# Patient Record
Sex: Male | Born: 1950
Health system: Southern US, Community
[De-identification: ages and names within clinical notes are randomized; demographics above are authoritative.]

## PROBLEM LIST (undated history)

## (undated) DIAGNOSIS — I10 Essential (primary) hypertension: Secondary | ICD-10-CM

## (undated) DIAGNOSIS — K219 Gastro-esophageal reflux disease without esophagitis: Secondary | ICD-10-CM

## (undated) DIAGNOSIS — N189 Chronic kidney disease, unspecified: Secondary | ICD-10-CM

## (undated) HISTORY — DX: Essential (primary) hypertension: I10

## (undated) HISTORY — DX: Gastro-esophageal reflux disease without esophagitis: K21.9

## (undated) HISTORY — DX: Chronic kidney disease, unspecified: N18.9

---

## 1995-07-13 DIAGNOSIS — F17200 Nicotine dependence, unspecified, uncomplicated: Secondary | ICD-10-CM | POA: Insufficient documentation

## 1995-08-28 DIAGNOSIS — K219 Gastro-esophageal reflux disease without esophagitis: Secondary | ICD-10-CM | POA: Insufficient documentation

## 1995-11-27 DIAGNOSIS — Z8709 Personal history of other diseases of the respiratory system: Secondary | ICD-10-CM | POA: Insufficient documentation

## 1998-11-02 DIAGNOSIS — M542 Cervicalgia: Secondary | ICD-10-CM | POA: Insufficient documentation

## 2005-10-27 DIAGNOSIS — L409 Psoriasis, unspecified: Secondary | ICD-10-CM | POA: Insufficient documentation

## 2011-08-02 DIAGNOSIS — Z7289 Other problems related to lifestyle: Secondary | ICD-10-CM | POA: Insufficient documentation

## 2012-04-29 DIAGNOSIS — E1142 Type 2 diabetes mellitus with diabetic polyneuropathy: Secondary | ICD-10-CM | POA: Insufficient documentation

## 2012-04-29 DIAGNOSIS — R209 Unspecified disturbances of skin sensation: Secondary | ICD-10-CM | POA: Insufficient documentation

## 2014-07-28 DIAGNOSIS — R809 Proteinuria, unspecified: Secondary | ICD-10-CM | POA: Insufficient documentation

## 2014-07-28 DIAGNOSIS — N1831 Chronic kidney disease, stage 3a: Secondary | ICD-10-CM | POA: Insufficient documentation

## 2014-10-23 DIAGNOSIS — Z87442 Personal history of urinary calculi: Secondary | ICD-10-CM | POA: Insufficient documentation

## 2014-10-23 DIAGNOSIS — K4020 Bilateral inguinal hernia, without obstruction or gangrene, not specified as recurrent: Secondary | ICD-10-CM | POA: Insufficient documentation

## 2015-06-07 DIAGNOSIS — D509 Iron deficiency anemia, unspecified: Secondary | ICD-10-CM | POA: Insufficient documentation

## 2016-10-08 DIAGNOSIS — I700 Atherosclerosis of aorta: Secondary | ICD-10-CM | POA: Insufficient documentation

## 2019-06-05 DIAGNOSIS — I639 Cerebral infarction, unspecified: Secondary | ICD-10-CM | POA: Insufficient documentation

## 2019-07-05 DIAGNOSIS — Z7901 Long term (current) use of anticoagulants: Secondary | ICD-10-CM | POA: Insufficient documentation

## 2020-06-28 ENCOUNTER — Encounter (HOSPITAL_BASED_OUTPATIENT_CLINIC_OR_DEPARTMENT_OTHER): Payer: Self-pay | Admitting: Family Medicine

## 2020-06-28 ENCOUNTER — Ambulatory Visit (INDEPENDENT_AMBULATORY_CARE_PROVIDER_SITE_OTHER): Payer: Medicare Other | Admitting: Family Medicine

## 2020-06-28 ENCOUNTER — Other Ambulatory Visit: Payer: Self-pay

## 2020-06-28 ENCOUNTER — Other Ambulatory Visit (HOSPITAL_BASED_OUTPATIENT_CLINIC_OR_DEPARTMENT_OTHER)
Admission: RE | Admit: 2020-06-28 | Discharge: 2020-06-28 | Disposition: A | Discharge status: Home / Self Care | Payer: Medicare Other | Source: Ambulatory Visit | Attending: Family Medicine | Admitting: Family Medicine

## 2020-06-28 VITALS — BP 144/70 | HR 67 | Ht 69.5 in | Wt 199.4 lb

## 2020-06-28 DIAGNOSIS — I10 Essential (primary) hypertension: Secondary | ICD-10-CM

## 2020-06-28 DIAGNOSIS — M545 Low back pain, unspecified: Secondary | ICD-10-CM | POA: Diagnosis not present

## 2020-06-28 DIAGNOSIS — E1122 Type 2 diabetes mellitus with diabetic chronic kidney disease: Secondary | ICD-10-CM

## 2020-06-28 DIAGNOSIS — N189 Chronic kidney disease, unspecified: Secondary | ICD-10-CM | POA: Insufficient documentation

## 2020-06-28 DIAGNOSIS — G8929 Other chronic pain: Secondary | ICD-10-CM

## 2020-06-28 DIAGNOSIS — Z7689 Persons encountering health services in other specified circumstances: Secondary | ICD-10-CM

## 2020-06-28 LAB — LIPID PANEL
Cholesterol: 105 mg/dL (ref 0–200)
HDL: 44 mg/dL (ref >40)
LDL Cholesterol: 14 mg/dL (ref 0–99)
Total CHOL/HDL Ratio: 2.4 RATIO
Triglycerides: 237 mg/dL — ABNORMAL HIGH (ref <150)
VLDL: 47 mg/dL — ABNORMAL HIGH (ref 0–40)

## 2020-06-28 LAB — CBC WITH DIFFERENTIAL/PLATELET
Abs Immature Granulocytes: 0.01 10*3/uL (ref 0.00–0.07)
Basophils Absolute: 0.1 10*3/uL (ref 0.0–0.1)
Basophils Relative: 1 %
Eosinophils Absolute: 1 10*3/uL — ABNORMAL HIGH (ref 0.0–0.5)
Eosinophils Relative: 12 %
HCT: 44.2 % (ref 39.0–52.0)
Hemoglobin: 14.1 g/dL (ref 13.0–17.0)
Immature Granulocytes: 0 %
Lymphocytes Relative: 25 %
Lymphs Abs: 2.1 10*3/uL (ref 0.7–4.0)
MCH: 28.3 pg (ref 26.0–34.0)
MCHC: 31.9 g/dL (ref 30.0–36.0)
MCV: 88.6 fL (ref 80.0–100.0)
Monocytes Absolute: 1.1 10*3/uL — ABNORMAL HIGH (ref 0.1–1.0)
Monocytes Relative: 13 %
Neutro Abs: 4.2 10*3/uL (ref 1.7–7.7)
Neutrophils Relative %: 49 %
Platelets: 166 10*3/uL (ref 150–400)
RBC: 4.99 MIL/uL (ref 4.22–5.81)
RDW: 13.8 % (ref 11.5–15.5)
WBC: 8.4 10*3/uL (ref 4.0–10.5)
nRBC: 0 % (ref 0.0–0.2)

## 2020-06-28 LAB — COMPREHENSIVE METABOLIC PANEL
ALT: 18 U/L (ref 0–44)
AST: 15 U/L (ref 15–41)
Albumin: 4.6 g/dL (ref 3.5–5.0)
Alkaline Phosphatase: 45 U/L (ref 38–126)
Anion gap: 9 (ref 5–15)
BUN: 24 mg/dL — ABNORMAL HIGH (ref 8–23)
CO2: 25 mmol/L (ref 22–32)
Calcium: 9.8 mg/dL (ref 8.9–10.3)
Chloride: 103 mmol/L (ref 98–111)
Creatinine, Ser: 1.48 mg/dL — ABNORMAL HIGH (ref 0.61–1.24)
GFR, Estimated: 51 mL/min — ABNORMAL LOW (ref >60)
Glucose, Bld: 101 mg/dL — ABNORMAL HIGH (ref 70–99)
Potassium: 4.4 mmol/L (ref 3.5–5.1)
Sodium: 137 mmol/L (ref 135–145)
Total Bilirubin: 0.5 mg/dL (ref 0.3–1.2)
Total Protein: 8 g/dL (ref 6.5–8.1)

## 2020-06-28 LAB — HEMOGLOBIN A1C
Hgb A1c MFr Bld: 7.2 % — ABNORMAL HIGH (ref 4.8–5.6)
Mean Plasma Glucose: 159.94 mg/dL

## 2020-06-28 NOTE — Assessment & Plan Note (Signed)
Check labs today, request records from prior PCP and nephrologist Continue with current medications including Metformin Current medication list indicates dose adjustment for moderate kidney disease

## 2020-06-28 NOTE — Progress Notes (Signed)
New Patient Office Visit  Subjective:  Patient ID: Frederick Tyler, male    DOB: 04-17-1950  Age: 69 y.o. MRN: [id number]  CC:  Chief Complaint  Patient presents with  . Establish Care  . Diabetes  . Back Pain    Lower back pain presented 2 week ago. Pt states pain is aggravated by prolonged standing or bending.    HPI Frederick Tyler is a 70 year old male presenting to establish in clinic.  He reports past medical history significant for hypertension, diabetes, GERD, chronic kidney disease, history of 2 prior strokes.  Does have a concern today regarding bilateral low back pain.  Patient has recently moved to the area from [place].  Low back pain: Has been present for a couple months.  Started in relation to recent move.  Pain is worsened with bending over, increased activity.  Indicates pain is in bilateral lumbar spine, no pain centrally.  Some radiation of pain into left buttock.  Has not tried much in the way of conservative treatment.  Hypertension: Reportedly taking losartan, isosorbide, nifedipine currently.  Denies any recent issues with chest pain, shortness of breath.  Diabetes: Reports A1c recently have been good.  Only medication for this is Metformin 500 mg twice daily.  Also engages in lifestyle modifications.  Chronic kidney disease: Was following with nephrology before moving to the area.  Does have to have dosage of medications adjusted due to current status of chronic kidney disease.  Specific staging of chronic kidney disease not known by patient.  Past Medical History:  Diagnosis Date  . Chronic kidney disease   . GERD (gastroesophageal reflux disease)   . Hypertension     History reviewed. No pertinent surgical history.  Family History  Family history unknown: Yes    Social History   Socioeconomic History  . Marital status: Married    Spouse name: Not on file  . Number of children: Not on file  . Years of education: Not on file  . Highest education level:  Not on file  Occupational History  . Not on file  Tobacco Use  . Smoking status: Never Smoker  . Smokeless tobacco: Never Used  Vaping Use  . Vaping Use: Never used  Substance and Sexual Activity  . Alcohol use: Never  . Drug use: Never  . Sexual activity: Yes    Birth control/protection: None  Other Topics Concern  . Not on file  Social History Narrative  . Not on file   Social Determinants of Health   Financial Resource Strain: Not on file  Food Insecurity: Not on file  Transportation Needs: Not on file  Physical Activity: Not on file  Stress: Not on file  Social Connections: Not on file  Intimate Partner Violence: Not on file    Objective:   Today's Vitals: BP (!) 144/70   Pulse 67   Ht 5' 9.5" (1.765 m)   Wt 199 lb 6.4 oz (90.4 kg)   SpO2 97%   BMI 29.02 kg/m   Physical Exam  Pleasant 70 year old male in no acute distress Cardiovascular him with regular rate and rhythm, no murmurs appreciated Lungs clear to auscultation bilaterally Lumbar spine: No tenderness to palpation over spinous processes, no tenderness to palpation through paraspinal muscles bilaterally.  Negative straight raise test.  Distal neurovascular exam intact.  Assessment & Plan:   Problem List Items Addressed This Visit      Cardiovascular and Mediastinum   Hypertension - Primary    Blood pressure very  slightly above goal with systolic at 144.  Goal blood pressure less than 140/90 given presence of diabetes and CKD. At this time, continue with current pharmacotherapy Will request records to verify medication dosages as well as prior blood pressure readings on current regimen Encourage lifestyle modifications, adherence to DASH diet, regular aerobic activity during the week.      Relevant Medications   losartan (COZAAR) 50 MG tablet   atenolol (TENORMIN) 100 MG tablet   aspirin 81 MG chewable tablet   isosorbide mononitrate (IMDUR) 60 MG 24 hr tablet   rosuvastatin (CRESTOR) 20 MG  tablet   Rivaroxaban (XARELTO) 15 MG TABS tablet   Other Relevant Orders   Ambulatory referral to Nephrology   CBC with Differential/Platelet   Comprehensive metabolic panel   Hemoglobin A1c   Lipid panel   POCT UA - Microalbumin     Endocrine   Diabetes mellitus with chronic kidney disease (HCC)    Check labs today, request records from prior PCP and nephrologist Continue with current medications including Metformin Current medication list indicates dose adjustment for moderate kidney disease      Relevant Medications   metFORMIN (GLUCOPHAGE) 500 MG tablet   losartan (COZAAR) 50 MG tablet   aspirin 81 MG chewable tablet   rosuvastatin (CRESTOR) 20 MG tablet   Other Relevant Orders   Ambulatory referral to Nephrology   Ambulatory referral to Ophthalmology   CBC with Differential/Platelet   Comprehensive metabolic panel   Hemoglobin A1c   Lipid panel     Other   Chronic low back pain    New problem, active Suspect musculoskeletal etiology for current symptoms Can use conservative measures including topical treatments with ice or heat as preferred Will refer to physical therapy for further evaluation and management and instruction on home exercise program      Relevant Medications   aspirin 81 MG chewable tablet   Other Relevant Orders   Ambulatory referral to Physical Therapy    Other Visit Diagnoses    Encounter to establish care with new doctor       Relevant Orders   Ambulatory referral to Nephrology   CBC with Differential/Platelet   Comprehensive metabolic panel   Hemoglobin A1c   Lipid panel   POCT UA - Microalbumin      Outpatient Encounter Medications as of 06/28/2020  Medication Sig  . aspirin 81 MG chewable tablet Chew 81 mg by mouth daily.  Marland Kitchen atenolol (TENORMIN) 100 MG tablet Take 100 mg by mouth 2 (two) times daily.  . famotidine (PEPCID) 20 MG tablet Take 40 mg by mouth at bedtime.  . isosorbide mononitrate (IMDUR) 60 MG 24 hr tablet Take 60 mg by  mouth daily.  Marland Kitchen loratadine (CLARITIN) 10 MG tablet Take 10 mg by mouth daily.  Marland Kitchen losartan (COZAAR) 50 MG tablet Take 50 mg by mouth daily.  . metFORMIN (GLUCOPHAGE) 500 MG tablet Take 500 mg by mouth 2 (two) times daily with a meal.  . Probiotic Product (PROBIOTIC BLEND PO) Take by mouth. NuZymes  . Rivaroxaban (XARELTO) 15 MG TABS tablet Take 15 mg by mouth 2 (two) times daily with a meal.  . rosuvastatin (CRESTOR) 20 MG tablet Take 20 mg by mouth daily.   No facility-administered encounter medications on file as of 06/28/2020.   Spent 60 minutes on this patient encounter, including preparation, chart review, face-to-face counseling with patient and coordination of care, and documentation of encounter  Follow-up: Return in about 2 months (around 08/28/2020).  Elijiah Mickley J De Peru, MD

## 2020-06-28 NOTE — Assessment & Plan Note (Signed)
Blood pressure very slightly above goal with systolic at 144.  Goal blood pressure less than 140/90 given presence of diabetes and CKD. At this time, continue with current pharmacotherapy Will request records to verify medication dosages as well as prior blood pressure readings on current regimen Encourage lifestyle modifications, adherence to DASH diet, regular aerobic activity during the week.

## 2020-06-28 NOTE — Patient Instructions (Addendum)
  Medication Instructions:  Your physician recommends that you continue on your current medications as directed. Please refer to the Current Medication list given to you today. --If you need a refill on any your medications before your next appointment, please call your pharmacy first. If no refills are authorized on file call the office.--  Lab Work: Your physician has recommended that you have lab work today: CBC, CMP, A1C, Lipid Profile,  And Urine Microalbumin If you have labs (blood work) drawn today and your tests are completely normal, you will receive your results only by: Marland Kitchen MyChart Message (if you have MyChart) OR . A phone call from our staff. Please ensure you check your voicemail in the event that you authorized detailed messages to be left on a delegated number. If you have any lab test that is abnormal or we need to change your treatment, we will call you to review the results.  Referrals/Procedures/Imaging: A referral has been placed for you to Ophthalmology for your annual Diabetic Retinal Exam. Someone from the scheduling department will be in contact with you in regards to coordinating your consultation. If you do not hear from any of the schedulers within 7-10 business days please give our office a call.  A referral has been placed for you to Nephrology for Chronic Kidney Disease. Someone from the scheduling department will be in contact with you in regards to coordinating your consultation. If you do not hear from any of the schedulers within 7-10 business days please give our office a call.  A referral has been placed for you to Physical Therapy at [facility]. Someone from the scheduling department will be in contact with you in regards to coordinating your consultation. If you do not hear from any of the schedulers within 7-10 business days please give our office a call.  Follow-Up: Your next appointment:   Your physician recommends that you  schedule a follow-up appointment in: 2 MONTHS witth Dr. De Peru  Thanks for letting us be apart of your health journey!!  Primary Care and Sports Medicine   Dr. de Peru and Shawna Clamp, DNP, AGNP  We recommend signing up for the patient portal called "MyChart".  Sign up information is provided on this After Visit Summary.  MyChart is used to connect with patients for Virtual Visits (Telemedicine).  Patients are able to view lab/test results, encounter notes, upcoming appointments, etc.  Non-urgent messages can be sent to your provider as well.   To learn more about what you can do with MyChart, go to [email or web address].

## 2020-06-28 NOTE — Assessment & Plan Note (Signed)
New problem, active Suspect musculoskeletal etiology for current symptoms Can use conservative measures including topical treatments with ice or heat as preferred Will refer to physical therapy for further evaluation and management and instruction on home exercise program

## 2020-06-29 ENCOUNTER — Telehealth (HOSPITAL_BASED_OUTPATIENT_CLINIC_OR_DEPARTMENT_OTHER): Payer: Self-pay

## 2020-06-29 DIAGNOSIS — E1122 Type 2 diabetes mellitus with diabetic chronic kidney disease: Secondary | ICD-10-CM

## 2020-06-29 LAB — POCT UA - MICROALBUMIN
Creatinine, POC: 300 mg/dL
Microalbumin Ur, POC: 150 mg/L

## 2020-06-29 NOTE — Telephone Encounter (Signed)
Upon verifying patients referrals were completed saw that patients referral to opthalmology for Diabetic retinal exam was denied Will put in a new order for scheduling.

## 2020-06-30 ENCOUNTER — Telehealth (HOSPITAL_BASED_OUTPATIENT_CLINIC_OR_DEPARTMENT_OTHER): Payer: Self-pay

## 2020-06-30 NOTE — Telephone Encounter (Signed)
-----   Message from Hosie Poisson Peru, MD sent at 06/30/2020 10:14 AM EDT ----- Urine testing for protein indicates moderately increased albuminuria (increased protein in the urine above normal due to diabetes). As discussed at office visit, referral has been placed to establish with Nephrology in regards to managing chronic kidney disease.

## 2020-06-30 NOTE — Telephone Encounter (Signed)
Results released by Dr. De [place] and reviewed by patient via MyChart Instructed patient to contact the office with any questions or concerns. 

## 2020-06-30 NOTE — Telephone Encounter (Signed)
-----   Message from Hosie Poisson Peru, MD sent at 06/29/2020  3:13 PM EDT ----- Hemoglobin A1c at 7.2% -feel that this is adequate control given age and comorbidities, likely need to strive for A1c to be less than 7.5%.  Lipid panel reveals normal total cholesterol, normal "good" cholesterol, normal "bad" cholesterol, slightly elevated triglycerides -recommend continuing with statin medication and lifestyle modifications.  Normal electrolytes, normal liver function, increased creatinine and decreased GFR, patient with known chronic kidney disease, will need to await outside records in order to compare current lab results with priors and ensure that it is stable.There is a mild increase in a specific type of white blood cell (eosinophil) - would recommend rechecking CBC in about 6 months to monitor this.

## 2020-07-05 ENCOUNTER — Telehealth (HOSPITAL_BASED_OUTPATIENT_CLINIC_OR_DEPARTMENT_OTHER): Payer: Self-pay | Admitting: Family Medicine

## 2020-07-05 NOTE — Telephone Encounter (Signed)
Tried obtaining records from patients  previous provider. The providers office has no record of pt for the past 5 years. Will re-ask pt when he comes in for f/u appt. Please advise.

## 2020-07-13 ENCOUNTER — Encounter (HOSPITAL_BASED_OUTPATIENT_CLINIC_OR_DEPARTMENT_OTHER): Payer: Self-pay | Admitting: Physical Therapy

## 2020-07-13 ENCOUNTER — Other Ambulatory Visit: Payer: Self-pay

## 2020-07-13 ENCOUNTER — Ambulatory Visit (HOSPITAL_BASED_OUTPATIENT_CLINIC_OR_DEPARTMENT_OTHER): Payer: Medicare Other | Attending: Family Medicine | Admitting: Physical Therapy

## 2020-07-13 DIAGNOSIS — M6281 Muscle weakness (generalized): Secondary | ICD-10-CM | POA: Insufficient documentation

## 2020-07-13 DIAGNOSIS — M545 Low back pain, unspecified: Secondary | ICD-10-CM | POA: Diagnosis not present

## 2020-07-13 DIAGNOSIS — R262 Difficulty in walking, not elsewhere classified: Secondary | ICD-10-CM | POA: Insufficient documentation

## 2020-07-13 DIAGNOSIS — G8929 Other chronic pain: Secondary | ICD-10-CM | POA: Insufficient documentation

## 2020-07-13 NOTE — Patient Instructions (Signed)
Access Code: [email or web address] URL: [email or web address] Date: 07/13/2020 Prepared by: Vernon Prey April Kirstie Peri  Exercises Supine Bridge - 1 x daily - 7 x weekly - 2 sets - 10 reps Clamshell with Resistance - 1 x daily - 7 x weekly - 2 sets - 10 reps Supine Lower Trunk Rotation - 1 x daily - 7 x weekly - 3 sets - 20-30 sec hold Cat-Camel to Child's Pose - 1 x daily - 7 x weekly - 3 sets - 20 sec hold Child's Pose with Sidebending - 1 x daily - 7 x weekly - 3 sets - 20-30 sec hold

## 2020-07-13 NOTE — Addendum Note (Signed)
Addended by: Jules Husbands MARIE L on: 07/13/2020 02:42 PM   Modules accepted: Orders

## 2020-07-13 NOTE — Therapy (Signed)
[facility] [address] [place], [place], [address] Phone: [phone]   Fax:  [phone]  Physical Therapy Evaluation  Patient Details  Name: Frederick Tyler MRN: [id number] Date of Birth: January 10, 1951 Referring Provider (PT): de Peru, Buren Kos, MD   Encounter Date: 07/13/2020   PT End of Session - 07/13/20 1331    Visit Number 1    Number of Visits 7    Date for PT Re-Evaluation 08/24/20    Authorization Type BC[employer]    PT Start Time 1340    PT Stop Time 1425    PT Time Calculation (min) 45 min    Activity Tolerance Patient tolerated treatment well    Behavior During Therapy [facility] for tasks assessed/performed           Past Medical History:  Diagnosis Date  . Chronic kidney disease   . GERD (gastroesophageal reflux disease)   . Hypertension     History reviewed. No pertinent surgical history.  There were no vitals filed for this visit.    Subjective Assessment - 07/13/20 1333    Subjective Pt has a back issue for ~20 years. Pt feels it most when he lifts something wrong (it takes about 2-3 weeks to mellow out). Pt states as he is getting older and he can feel it more. Pt states that it can go down either leg. Pt can also feel it when walking up inclines including stair cases. Pt has been walking a mile every day. Pt reports he slipped his disc in his 41s.    Patient is accompained by: Family member    Pertinent History CKD, GERD, HTN    Limitations Lifting;Standing;Walking    How long can you sit comfortably? no issues    How long can you stand comfortably? If standing straight it is okay, but leaning forward for ~10-15 minutes he can feel it.    How long can you walk comfortably? Level walking he can feel it in his calves (reports history of blockage in both legs).    Patient Stated Goals Decrease pain with walking, exercises to help with strength    Currently in Pain? No/denies   Sitting   Pain Score --   5/10 at worst  when going up incline   Pain Location Back    Pain Orientation Left;Right    Pain Descriptors / Indicators Tightness;Sharp    Pain Type Chronic pain    Pain Radiating Towards Either leg    Pain Onset More than a month ago    Pain Frequency Intermittent    Aggravating Factors  Lifting, walking inclines,    Pain Relieving Factors Topical cream, heat or ice              [facility] PT Assessment - 07/13/20 0001      Assessment   Medical Diagnosis M54.50,G89.29 (ICD-10-CM) - Chronic bilateral low back pain without sciatica    Referring Provider (PT) de Peru, Buren Kos, MD    Prior Therapy None; has used chiropractor      Precautions   Precautions None      Restrictions   Weight Bearing Restrictions No      Balance Screen   Has the patient fallen in the past 6 months No      Home Environment   Living Environment Private residence    Living Arrangements Spouse/significant other    Available Help at Discharge Family    Type of Home House    Home Access  Stairs to enter    [employer] of Steps 2-3    Home Layout One level    Home Equipment None      Prior Function   Level of Independence Independent    Vocation Retired    Leisure Walking      Observation/Other Assessments   Focus on Therapeutic Outcomes (FOTO)  n/a      Functional Tests   Functional tests Sit to Stand;Squat;Single leg stance      Squat   Comments Increased knee flexion, weight forward on toes, decreased BOS      Single Leg Stance   Comments L LE (compensated trendelenburg): 37 sec; R LE (compensated trendelenburg): 31 sec      Sit to Stand   Comments 5x STS: 12 sec      ROM / Strength   AROM / PROM / Strength AROM;Strength      AROM   AROM Assessment Site Lumbar    Lumbar Flexion 100%    Lumbar Extension 100%    Lumbar - Right Side Bend 75%   Feels on right   Lumbar - Left Side Bend 90%   Can feel on right side of back   Lumbar - Right Rotation 80%    Lumbar - Left Rotation 80%       Strength   Strength Assessment Site Hip;Knee;Ankle    Right/Left Hip Right;Left    Right Hip Flexion 5/5    Right Hip Extension 3+/5    Right Hip External Rotation  4-/5    Right Hip Internal Rotation 4+/5    Right Hip ABduction 4-/5    Left Hip Flexion 5/5    Left Hip Extension 4-/5    Left Hip External Rotation 4-/5    Left Hip Internal Rotation 4+/5    Left Hip ABduction 4-/5    Right/Left Knee --   Grossly 5/5     Flexibility   Soft Tissue Assessment /Muscle Length yes    Hamstrings >100 deg L SLR, ~80 deg R SLR      Palpation   Palpation comment Tight and short QL and glute      Special Tests    Special Tests Lumbar    Lumbar Tests FABER test;Slump Test;Prone Knee Bend Test;Straight Leg Raise      FABER test   findings Negative    Comment feels in R & L hip (R tighter than L)      Slump test   Findings Negative      Prone Knee Bend Test   Findings Negative      Straight Leg Raise   Findings Negative    Comment R tighter hamstring than L      Ambulation/Gait   Ambulation Distance (Feet) 150 Feet    Assistive device None    Gait Pattern Step-through pattern    Ambulation Surface Level;Indoor                      Objective measurements completed on examination: See above findings.               PT Education - 07/13/20 1440    Education Details Discussed exam findings, POC, and HEP    Person(s) Educated Patient    Methods Explanation;Demonstration;Verbal cues;Handout;Tactile cues    Comprehension Verbalized understanding;Tactile cues required;Need further instruction;Verbal cues required;Returned demonstration               PT Long Term Goals - 07/13/20 1432  PT LONG TERM GOAL #1   Title Pt will be independent with HEP    Time 6    Period Weeks    Status New    Target Date 08/24/20      PT LONG TERM GOAL #2   Title Pt will be able to report >50% decrease in pain when walking inclines and stairs    Baseline 5/10  at worst    Time 6    Period Weeks    Status New    Target Date 08/24/20      PT LONG TERM GOAL #3   Title Pt will be able to demo improved functional hip strength by performing at least 10 squats with proper form    Baseline Can feel quad fatigue after 3 squats    Time 6    Period Weeks    Status New    Target Date 08/24/20                  Plan - 07/13/20 1424    Clinical Impression Statement Mr. Iden is a 70 y/o M presenting to OPPT due to chronic bilat low back pain. PMH significant for CKD, GERD, HTN, and reports CVA 6 months ago (reports most of issue was his peripheral vision and balance). On assessment, pt demos R>L hip tightness, hyperactive and shortened bilat QLs, and weak hip extensors & abductors affecting pt's ability to perform prolonged lean forwards and walking up inclines and stairs. Pt would benefit from therapy to address his mobility issues to optimize his level of function for his leisure activities.    Personal Factors and Comorbidities Age;Fitness;Past/Current Experience;Comorbidity 1;Time since onset of injury/illness/exacerbation    Comorbidities CKD, GERD, HTN, stroke 6 months ago (peripheral vision and balance affected)    Examination-Activity Limitations Locomotion Level;Stairs;Stand    Examination-Participation Restrictions Community Activity;Yard Work    Conservation officer, historic buildings Evolving/Moderate complexity    Clinical Decision Making Moderate    Rehab Potential Good    PT Frequency 1x / week    PT Duration 6 weeks    PT Treatment/Interventions ADLs/Self Care Home Management;Iontophoresis 4mg /ml Dexamethasone;Moist Heat;Traction;Cryotherapy;Gait training;Stair training;Functional mobility training;Therapeutic activities;Therapeutic exercise;Balance training;Neuromuscular re-education;Manual techniques;Patient/family education;Passive range of motion;Dry needling;Taping;Spinal Manipulations    PT Next Visit Plan Assess response to HEP.  Include hamstring and more hip stretching. Continue hip (especially glute) strengthening -- consider exercises in standing. Work and .    PT Home Exercise Plan Access Code: [email or web address]    Consulted and Agree with Plan of Care Patient           Patient will benefit from skilled therapeutic intervention in order to improve the following deficits and impairments:  Difficulty walking,Increased fascial restricitons,Decreased activity tolerance,Pain,Hypomobility,Impaired flexibility,Improper body mechanics,Decreased mobility,Decreased strength  Visit Diagnosis: Chronic bilateral low back pain, unspecified whether sciatica present  Muscle weakness (generalized)  Difficulty in walking, not elsewhere classified     Problem List Patient Active Problem List   Diagnosis Date Noted  . Chronic kidney disease 06/28/2020  . Diabetes mellitus with chronic kidney disease (HCC) 06/28/2020  . Hypertension 06/28/2020  . Chronic low back pain 06/28/2020    [facility] [address] [place] PT, DPT 07/13/2020, 2:41 PM  [facility] [address] [place], [place], [place] Phone: [phone]   Fax:  [phone]  Name: Frederick Tyler MRN: [name] Date of Birth: 09/06/1950

## 2020-07-20 ENCOUNTER — Other Ambulatory Visit: Payer: Self-pay

## 2020-07-20 ENCOUNTER — Encounter (HOSPITAL_BASED_OUTPATIENT_CLINIC_OR_DEPARTMENT_OTHER): Payer: Self-pay | Admitting: Physical Therapy

## 2020-07-20 ENCOUNTER — Ambulatory Visit (HOSPITAL_BASED_OUTPATIENT_CLINIC_OR_DEPARTMENT_OTHER): Payer: Medicare Other | Attending: Family Medicine | Admitting: Physical Therapy

## 2020-07-20 DIAGNOSIS — G8929 Other chronic pain: Secondary | ICD-10-CM | POA: Insufficient documentation

## 2020-07-20 DIAGNOSIS — M545 Low back pain, unspecified: Secondary | ICD-10-CM | POA: Diagnosis not present

## 2020-07-20 DIAGNOSIS — R262 Difficulty in walking, not elsewhere classified: Secondary | ICD-10-CM | POA: Diagnosis not present

## 2020-07-20 DIAGNOSIS — M6281 Muscle weakness (generalized): Secondary | ICD-10-CM | POA: Diagnosis not present

## 2020-07-20 NOTE — Therapy (Signed)
[facility] [address] [place], [place], [address] Phone: [phone]   Fax:  [phone]  Physical Therapy Treatment  Patient Details  Name: Frederick Tyler MRN: [id number] Date of Birth: 1950/05/08 Referring Provider (PT): de [place], Buren Kos, MD   Encounter Date: 07/20/2020   PT End of Session - 07/20/20 1639    Visit Number 2    Number of Visits 7    Date for PT Re-Evaluation 08/24/20    Authorization Type BCBS    PT Start Time 1300    PT Stop Time 1340    PT Time Calculation (min) 40 min    Activity Tolerance Patient tolerated treatment well    Behavior During Therapy [facility] for tasks assessed/performed           Past Medical History:  Diagnosis Date  . Chronic kidney disease   . GERD (gastroesophageal reflux disease)   . Hypertension     History reviewed. No pertinent surgical history.  There were no vitals filed for this visit.   Subjective Assessment - 07/20/20 1307    Subjective Patient had some pain in his hips yesterday but he was able to use his stretches to reduce his pain. He has been working on his stretches and exercises at home.    Pertinent History CKD, GERD, HTN    Limitations Lifting;Standing;Walking    How long can you sit comfortably? no issues    How long can you stand comfortably? If standing straight it is okay, but leaning forward for ~10-15 minutes he can feel it.    How long can you walk comfortably? Level walking he can feel it in his calves (reports history of blockage in both legs).    Patient Stated Goals Decrease pain with walking, exercises to help with strength    Currently in Pain? No/denies   mild pain yesterday but gone today.                            [facility] Adult PT Treatment/Exercise - 07/20/20 0001      Lumbar Exercises: Stretches   Active Hamstring Stretch Limitations 3x20 sec hold seated    Lower Trunk Rotation Limitations x10 20 sec hold bilateral  reports this is working well for hom at home    Piriformis Stretch Limitations 2x30 sec hold bilateral    Other Lumbar Stretch Exercise reviewed plateral paryer with hand held traction improved stretch 2x20 sec each side;      Lumbar Exercises: Aerobic   Nustep 5 min L3 UE /LE      Lumbar Exercises: Seated   Other Seated Lumbar Exercises bilateral er green with abdominal breathing and cuing for posture 2x15      Lumbar Exercises: Supine   AB Set Limitations reviewed proper breathing technique    Bridge Limitations 2x10    Other Supine Lumbar Exercises supine march 3x10 reviewed how to progress;      Manual Therapy   Manual Therapy Manual Traction    Manual therapy comments assessed trigger points    Manual Traction LAD to right LE grade III and IV                  PT Education - 07/20/20 1639    Education Details updated HEP, reviewed how to use it    Person(s) Educated Patient    Methods Explanation;Tactile cues;Demonstration;Verbal cues;Handout    Comprehension Verbalized understanding;Returned demonstration;Verbal cues required;Tactile cues required;Need  further instruction               PT Long Term Goals - 07/13/20 1432      PT LONG TERM GOAL #1   Title Pt will be independent with HEP    Time 6    Period Weeks    Status New    Target Date 08/24/20      PT LONG TERM GOAL #2   Title Pt will be able to report >50% decrease in pain when walking inclines and stairs    Baseline 5/10 at worst    Time 6    Period Weeks    Status New    Target Date 08/24/20      PT LONG TERM GOAL #3   Title Pt will be able to demo improved functional hip strength by performing at least 10 squats with proper form    Baseline Can feel quad fatigue after 3 squats    Time 6    Period Weeks    Status New    Target Date 08/24/20                 Plan - 07/20/20 1640    Clinical Impression Statement Patient is making progress. Patient assessed trigger points. He had a  mild spasm in his right gluteal. He otherwise had no trigger points. He was given a new UE exercise to work on. Therapy reviewed stretches. He had some difficulty at home with lateral prayer stretch. Therapy reviewed it with the patient and worked on technique. He reported improved ability to perfrom with cuing. He was also given exercices he can do in sitting.    Personal Factors and Comorbidities Age;Fitness;Past/Current Experience;Comorbidity 1;Time since onset of injury/illness/exacerbation    Comorbidities CKD, GERD, HTN, stroke 6 months ago (peripheral vision and balance affected)    Examination-Activity Limitations Locomotion Level;Stairs;Stand    Examination-Participation Restrictions Community Activity;Yard Work    Conservation officer, historic buildings Evolving/Moderate complexity    Clinical Decision Making Moderate    Rehab Potential Good    PT Frequency 1x / week    PT Duration 6 weeks    PT Treatment/Interventions ADLs/Self Care Home Management;Iontophoresis 4mg /ml Dexamethasone;Moist Heat;Traction;Cryotherapy;Gait training;Stair training;Functional mobility training;Therapeutic activities;Therapeutic exercise;Balance training;Neuromuscular re-education;Manual techniques;Patient/family education;Passive range of motion;Dry needling;Taping;Spinal Manipulations    PT Next Visit Plan Assess response to HEP. Include hamstring and more hip stretching. Continue hip (especially glute) strengthening -- consider exercises in standing. Work and .    PT Home Exercise Plan Access Code: [email or web address]    Consulted and Agree with Plan of Care Patient           Patient will benefit from skilled therapeutic intervention in order to improve the following deficits and impairments:  Difficulty walking,Increased fascial restricitons,Decreased activity tolerance,Pain,Hypomobility,Impaired flexibility,Improper body mechanics,Decreased mobility,Decreased strength  Visit Diagnosis: Chronic  bilateral low back pain, unspecified whether sciatica present  Muscle weakness (generalized)  Difficulty in walking, not elsewhere classified     Problem List Patient Active Problem List   Diagnosis Date Noted  . Chronic kidney disease 06/28/2020  . Diabetes mellitus with chronic kidney disease (HCC) 06/28/2020  . Hypertension 06/28/2020  . Chronic low back pain 06/28/2020    08/28/2020 PT DPT  07/20/2020, 4:48 PM  [facility] [address] [place], [place], [place] Phone: [phone]   Fax:  [phone]  Name: Frederick Tyler MRN: [name] Date of Birth: [birth year]

## 2020-07-26 DIAGNOSIS — I129 Hypertensive chronic kidney disease with stage 1 through stage 4 chronic kidney disease, or unspecified chronic kidney disease: Secondary | ICD-10-CM | POA: Diagnosis not present

## 2020-07-26 DIAGNOSIS — D631 Anemia in chronic kidney disease: Secondary | ICD-10-CM | POA: Diagnosis not present

## 2020-07-26 DIAGNOSIS — N1831 Chronic kidney disease, stage 3a: Secondary | ICD-10-CM | POA: Diagnosis not present

## 2020-07-26 DIAGNOSIS — N2581 Secondary hyperparathyroidism of renal origin: Secondary | ICD-10-CM | POA: Diagnosis not present

## 2020-07-27 ENCOUNTER — Ambulatory Visit (HOSPITAL_BASED_OUTPATIENT_CLINIC_OR_DEPARTMENT_OTHER): Payer: Medicare Other | Attending: Family Medicine | Admitting: Physical Therapy

## 2020-07-27 ENCOUNTER — Other Ambulatory Visit: Payer: Self-pay | Admitting: Nephrology

## 2020-07-27 ENCOUNTER — Other Ambulatory Visit: Payer: Self-pay

## 2020-07-27 ENCOUNTER — Encounter (HOSPITAL_BASED_OUTPATIENT_CLINIC_OR_DEPARTMENT_OTHER): Payer: Self-pay | Admitting: Physical Therapy

## 2020-07-27 DIAGNOSIS — G8929 Other chronic pain: Secondary | ICD-10-CM | POA: Insufficient documentation

## 2020-07-27 DIAGNOSIS — R262 Difficulty in walking, not elsewhere classified: Secondary | ICD-10-CM | POA: Diagnosis not present

## 2020-07-27 DIAGNOSIS — M6281 Muscle weakness (generalized): Secondary | ICD-10-CM | POA: Insufficient documentation

## 2020-07-27 DIAGNOSIS — M545 Low back pain, unspecified: Secondary | ICD-10-CM | POA: Insufficient documentation

## 2020-07-27 DIAGNOSIS — N1831 Chronic kidney disease, stage 3a: Secondary | ICD-10-CM

## 2020-07-28 NOTE — Therapy (Signed)
[facility] [address] [place], [place], [address] Phone: [phone]   Fax:  [phone]  Physical Therapy Treatment  Patient Details  Name: Nathaneal Sommers MRN: [id number] Date of Birth: [birth year] Referring Provider (PT): de [place], Buren Kos, MD   Encounter Date: 07/27/2020   PT End of Session - 07/28/20 1621    Visit Number 3    Number of Visits 7    Date for PT Re-Evaluation 08/24/20    Authorization Type BCBS    PT Start Time 1345    PT Stop Time 1424    PT Time Calculation (min) 39 min    Activity Tolerance Patient tolerated treatment well    Behavior During Therapy [facility] for tasks assessed/performed           Past Medical History:  Diagnosis Date  . Chronic kidney disease   . GERD (gastroesophageal reflux disease)   . Hypertension     History reviewed. No pertinent surgical history.  There were no vitals filed for this visit.   Subjective Assessment - 07/28/20 1253    Subjective Patient reports mid back tightness but no significant pain in his lower back and hips. He has been going to the gym and walking.    Pertinent History CKD, GERD, HTN    Limitations Lifting;Standing;Walking    Currently in Pain? No/denies                             [facility] Adult PT Treatment/Exercise - 07/28/20 0001      Lumbar Exercises: Stretches   Active Hamstring Stretch Limitations 3x20 sec hold seated    Lower Trunk Rotation Limitations x10 20 sec hold bilateral reports this is working well for hom at home    Piriformis Stretch Limitations 2x30 sec hold bilateral    Other Lumbar Stretch Exercise reviewed plateral paryer with hand held traction improved stretch 2x20 sec each side;      Lumbar Exercises: Standing   Other Standing Lumbar Exercises chops 10lbs 2x10 each way pallof press 2x10 each way triceps extension 2x10 each way 10lbs all with cuing for posture and breathing      Lumbar Exercises: Supine   AB  Set Limitations reviewed proper breathing technique    Bridge Limitations 2x10    Other Supine Lumbar Exercises supine march 3x10 reviewed how to progress;      Manual Therapy   Manual therapy comments PA to thoracic spine and soft tissue mobilization to the paraspainls                  PT Education - 07/28/20 1621    Education Details HEP and symptom mangement    Person(s) Educated Patient    Methods Explanation;Demonstration;Tactile cues;Verbal cues    Comprehension Verbalized understanding;Returned demonstration;Verbal cues required;Tactile cues required               PT Long Term Goals - 07/13/20 1432      PT LONG TERM GOAL #1   Title Pt will be independent with HEP    Time 6    Period Weeks    Status New    Target Date 08/24/20      PT LONG TERM GOAL #2   Title Pt will be able to report >50% decrease in pain when walking inclines and stairs    Baseline 5/10 at worst    Time 6    Period Weeks  Status New    Target Date 08/24/20      PT LONG TERM GOAL #3   Title Pt will be able to demo improved functional hip strength by performing at least 10 squats with proper form    Baseline Can feel quad fatigue after 3 squats    Time 6    Period Weeks    Status New    Target Date 08/24/20                 Plan - 07/27/20 1421    Clinical Impression Statement Patient is making great progress. He had some mid back oain this morning. therapy perfromed manual therapy to the spot. he had mild trigger points. He was given a tennis ball for self trigger point release. Therapy reviewed gym stablization exercises with him as well. he reported feeling good after treatemnt.    Personal Factors and Comorbidities Age;Fitness;Past/Current Experience;Comorbidity 1;Time since onset of injury/illness/exacerbation    Comorbidities CKD, GERD, HTN, stroke 6 months ago (peripheral vision and balance affected)    Examination-Activity Limitations Locomotion Level;Stairs;Stand     Examination-Participation Restrictions Community Activity;Yard Work    Conservation officer, historic buildings Evolving/Moderate complexity    Clinical Decision Making Moderate    Rehab Potential Good    PT Frequency 1x / week    PT Duration 6 weeks    PT Treatment/Interventions ADLs/Self Care Home Management;Iontophoresis 4mg /ml Dexamethasone;Moist Heat;Traction;Cryotherapy;Gait training;Stair training;Functional mobility training;Therapeutic activities;Therapeutic exercise;Balance training;Neuromuscular re-education;Manual techniques;Patient/family education;Passive range of motion;Dry needling;Taping;Spinal Manipulations    PT Next Visit Plan Assess response to HEP. Include hamstring and more hip stretching. Continue hip (especially glute) strengthening -- consider exercises in standing. Work and .    PT Home Exercise Plan Access Code: [email or web address]    Consulted and Agree with Plan of Care Patient           Patient will benefit from skilled therapeutic intervention in order to improve the following deficits and impairments:  Difficulty walking,Increased fascial restricitons,Decreased activity tolerance,Pain,Hypomobility,Impaired flexibility,Improper body mechanics,Decreased mobility,Decreased strength  Visit Diagnosis: Chronic bilateral low back pain, unspecified whether sciatica present  Muscle weakness (generalized)  Difficulty in walking, not elsewhere classified     Problem List Patient Active Problem List   Diagnosis Date Noted  . Chronic kidney disease 06/28/2020  . Diabetes mellitus with chronic kidney disease (HCC) 06/28/2020  . Hypertension 06/28/2020  . Chronic low back pain 06/28/2020    08/28/2020 PT DPT  07/28/2020, 4:23 PM  [facility] [address] [place], [place], [place] Phone: [phone]   Fax:  [phone]  Name: Benz Vandenberghe MRN: [name] Date of Birth: 11/25/1950

## 2020-08-02 ENCOUNTER — Other Ambulatory Visit: Payer: Self-pay

## 2020-08-02 ENCOUNTER — Encounter (HOSPITAL_BASED_OUTPATIENT_CLINIC_OR_DEPARTMENT_OTHER): Payer: Self-pay | Admitting: Physical Therapy

## 2020-08-02 ENCOUNTER — Ambulatory Visit (HOSPITAL_BASED_OUTPATIENT_CLINIC_OR_DEPARTMENT_OTHER): Payer: Medicare Other | Admitting: Physical Therapy

## 2020-08-02 DIAGNOSIS — M545 Low back pain, unspecified: Secondary | ICD-10-CM

## 2020-08-02 DIAGNOSIS — M6281 Muscle weakness (generalized): Secondary | ICD-10-CM

## 2020-08-02 DIAGNOSIS — R262 Difficulty in walking, not elsewhere classified: Secondary | ICD-10-CM

## 2020-08-02 DIAGNOSIS — G8929 Other chronic pain: Secondary | ICD-10-CM

## 2020-08-02 NOTE — Therapy (Signed)
[facility] [address] [place], [place], [address] Phone: [phone]   Fax:  [phone]  Physical Therapy Treatment  Patient Details  Name: Quamir Willemsen MRN: [id number] Date of Birth: [birth year] Referring Provider (PT): de [place], Buren Kos, MD   Encounter Date: 08/02/2020   PT End of Session - 08/02/20 1340    Visit Number 4    Number of Visits 7    Date for PT Re-Evaluation 08/24/20    Authorization Type BCBS    PT Start Time 1300    PT Stop Time 1344    PT Time Calculation (min) 44 min    Activity Tolerance Patient tolerated treatment well    Behavior During Therapy [facility] for tasks assessed/performed           Past Medical History:  Diagnosis Date  . Chronic kidney disease   . GERD (gastroesophageal reflux disease)   . Hypertension     History reviewed. No pertinent surgical history.  There were no vitals filed for this visit.   Subjective Assessment - 08/02/20 1306    Subjective Patient reports he has been feeling good. He has been having soem pain when he is bending over but otherwise his back has been doing well.    Pertinent History CKD, GERD, HTN    Limitations Lifting;Standing;Walking    How long can you sit comfortably? no issues    How long can you stand comfortably? If standing straight it is okay, but leaning forward for ~10-15 minutes he can feel it.    How long can you walk comfortably? Level walking he can feel it in his calves (reports history of blockage in both legs).    Patient Stated Goals Decrease pain with walking, exercises to help with strength    Currently in Pain? No/denies                             [facility] Adult PT Treatment/Exercise - 08/02/20 0001      Lumbar Exercises: Stretches   Lower Trunk Rotation Limitations x10 20 sec hold bilateral reports this is working well for hom at home    Piriformis Stretch Limitations 2x30 sec hold bilateral    Other Lumbar Stretch  Exercise reviewed plateral paryer with hand held traction improved stretch 2x20 sec each side;      Lumbar Exercises: Machines for Strengthening   Other Lumbar Machine Exercise cybe leg press 70 lbs 2x10 started with 50 lbs    Other Lumbar Machine Exercise hip abduction machine 40 lbs 3x10      Lumbar Exercises: Supine   AB Set Limitations breathing technique with ther-ex    Bridge Limitations x20    Straight Leg Raises Limitations 2x10 each leg      Manual Therapy   Manual therapy comments PA in lower lumbar spien    Manual Traction trigger point release to lower lumbar spine                  PT Education - 08/02/20 1337    Education Details Patient worked on hip hinging and technique with squats. he tolerated well with min cuing. We will continute to progress fucntional    Person(s) Educated Patient    Methods Explanation;Demonstration;Tactile cues;Verbal cues    Comprehension Verbalized understanding;Returned demonstration;Verbal cues required;Tactile cues required               PT Long Term Goals - 08/02/20 1408  PT LONG TERM GOAL #1   Title Pt will be independent with HEP    Time 6    Status New      PT LONG TERM GOAL #2   Title Pt will be able to report >50% decrease in pain when walking inclines and stairs    Baseline 5/10 at worst    Time 6    Period Weeks    Status On-going      PT LONG TERM GOAL #3   Title Pt will be able to demo improved functional hip strength by performing at least 10 squats with proper form    Baseline perfromed several today    Time 6    Period Weeks    Status New    Target Date 08/23/20                 Plan - 08/02/20 1341    Clinical Impression Statement Patient continues to make great progress. He reported some pain bedning. Therapy worked on lower lumbar mobility with the patient to improve his ability to bend to pik things up. He continues to have some spasming in the lower lumbar spine. He tolerated hip  hinge well but needed some cuing to keep his weight back. Therapy also reviewed lower lumbar strengthening execrises with the patient. he will be going on vacation next week. He will continue working on his stretches and exercises and be aware of his lifting technique.    Personal Factors and Comorbidities Age;Fitness;Past/Current Experience;Comorbidity 1;Time since onset of injury/illness/exacerbation    Comorbidities CKD, GERD, HTN, stroke 6 months ago (peripheral vision and balance affected)    Examination-Activity Limitations Locomotion Level;Stairs;Stand    Examination-Participation Restrictions Community Activity;Yard Work    Conservation officer, historic buildings Evolving/Moderate complexity    Clinical Decision Making Moderate    Rehab Potential Good    PT Frequency 1x / week    PT Duration 6 weeks    PT Treatment/Interventions ADLs/Self Care Home Management;Iontophoresis 4mg /ml Dexamethasone;Moist Heat;Traction;Cryotherapy;Gait training;Stair training;Functional mobility training;Therapeutic activities;Therapeutic exercise;Balance training;Neuromuscular re-education;Manual techniques;Patient/family education;Passive range of motion;Dry needling;Taping;Spinal Manipulations    PT Next Visit Plan . Work and . continue to review gym activity    PT Home Exercise Plan Access Code: [email or web address]           Patient will benefit from skilled therapeutic intervention in order to improve the following deficits and impairments:  Difficulty walking,Increased fascial restricitons,Decreased activity tolerance,Pain,Hypomobility,Impaired flexibility,Improper body mechanics,Decreased mobility,Decreased strength  Visit Diagnosis: Chronic bilateral low back pain, unspecified whether sciatica present  Muscle weakness (generalized)  Difficulty in walking, not elsewhere classified     Problem List Patient Active Problem List   Diagnosis Date Noted  . Chronic kidney disease 06/28/2020  .  Diabetes mellitus with chronic kidney disease (HCC) 06/28/2020  . Hypertension 06/28/2020  . Chronic low back pain 06/28/2020    08/28/2020 PT DPT  08/02/2020, 3:35 PM  [facility] [address] [place], [place], [place] Phone: [phone]   Fax:  [phone]  Name: Erikson Danzy MRN: [name] Date of Birth: [birth year]

## 2020-08-10 ENCOUNTER — Ambulatory Visit (HOSPITAL_BASED_OUTPATIENT_CLINIC_OR_DEPARTMENT_OTHER): Payer: Medicare Other | Admitting: Physical Therapy

## 2020-08-12 ENCOUNTER — Ambulatory Visit
Admission: RE | Admit: 2020-08-12 | Discharge: 2020-08-12 | Disposition: A | Discharge status: Home / Self Care | Payer: Medicare Other | Source: Ambulatory Visit | Attending: Nephrology | Admitting: Nephrology

## 2020-08-12 DIAGNOSIS — N1831 Chronic kidney disease, stage 3a: Secondary | ICD-10-CM | POA: Diagnosis not present

## 2020-08-17 ENCOUNTER — Other Ambulatory Visit: Payer: Self-pay

## 2020-08-17 ENCOUNTER — Ambulatory Visit (HOSPITAL_BASED_OUTPATIENT_CLINIC_OR_DEPARTMENT_OTHER): Payer: Medicare Other | Admitting: Physical Therapy

## 2020-08-17 DIAGNOSIS — G8929 Other chronic pain: Secondary | ICD-10-CM

## 2020-08-17 DIAGNOSIS — R262 Difficulty in walking, not elsewhere classified: Secondary | ICD-10-CM

## 2020-08-17 DIAGNOSIS — M6281 Muscle weakness (generalized): Secondary | ICD-10-CM | POA: Diagnosis not present

## 2020-08-17 DIAGNOSIS — M545 Low back pain, unspecified: Secondary | ICD-10-CM | POA: Diagnosis not present

## 2020-08-18 NOTE — Therapy (Signed)
[facility] [address] [place], [place], [address] Phone: [phone]   Fax:  [phone]  Physical Therapy Treatment  Patient Details  Name: Md Smola MRN: [id number] Date of Birth: [birth year] Referring Provider (PT): de [place], Buren Kos, MD   Encounter Date: 08/17/2020   PT End of Session - 08/17/20 1341    Visit Number 5    Number of Visits 7    Date for PT Re-Evaluation 08/24/20    PT Start Time 1330    PT Stop Time 1417    PT Time Calculation (min) 47 min    Activity Tolerance Patient tolerated treatment well    Behavior During Therapy [facility] for tasks assessed/performed           Past Medical History:  Diagnosis Date  . Chronic kidney disease   . GERD (gastroesophageal reflux disease)   . Hypertension     No past surgical history on file.  There were no vitals filed for this visit.   Subjective Assessment - 08/17/20 1354    Subjective Patient reports he had some spasming last night but it went away in about an hour. Overall his back has been doing well. He is coming to the gym daily.    Patient is accompained by: Family member    Pertinent History CKD, GERD, HTN    Limitations Lifting;Standing;Walking    How long can you stand comfortably? If standing straight it is okay, but leaning forward for ~10-15 minutes he can feel it.    How long can you walk comfortably? Level walking he can feel it in his calves (reports history of blockage in both legs).    Patient Stated Goals Decrease pain with walking, exercises to help with strength    Currently in Pain? No/denies                             [facility] Adult PT Treatment/Exercise - 08/18/20 0001      Self-Care   Self-Care Other Self-Care Comments    Other Self-Care Comments  reviewed how to use his program going forward      Lumbar Exercises: Stretches   Lower Trunk Rotation Limitations x10 20 sec hold bilateral reports this is working well  for hom at home      Lumbar Exercises: Machines for Strengthening   Other Lumbar Machine Exercise cybe leg press 70 lbs 2x10 started with 50 lbs; pallof press 10lbs x20 each way; chop x20 each way; row 25lbs x20; triceps extension x20 with breathing; shoulder extension with breathing x0    Other Lumbar Machine Exercise hip abduction machine 40 lbs 3x10                  PT Education - 08/17/20 1418    Education Details reviewed a program to work on for the next month    Person(s) Educated Patient    Methods Explanation;Demonstration;Tactile cues;Verbal cues    Comprehension Verbalized understanding;Returned demonstration;Verbal cues required;Tactile cues required               PT Long Term Goals - 08/02/20 1408      PT LONG TERM GOAL #1   Title Pt will be independent with HEP    Time 6    Status New      PT LONG TERM GOAL #2   Title Pt will be able to report >50% decrease in pain when walking inclines and stairs  Baseline 5/10 at worst    Time 6    Period Weeks    Status On-going      PT LONG TERM GOAL #3   Title Pt will be able to demo improved functional hip strength by performing at least 10 squats with proper form    Baseline perfromed several today    Time 6    Period Weeks    Status New    Target Date 08/23/20                 Plan - 08/17/20 1450    Clinical Impression Statement The patient requested a program that he can work on for the next month. Therapy reviewed an UE series and a lower extremity series of exercises for the patient to perform. Overall he is making ggood progress. he would like to come back in a moth for 1 more follow up.    Personal Factors and Comorbidities Age;Fitness;Past/Current Experience;Comorbidity 1;Time since onset of injury/illness/exacerbation    Comorbidities CKD, GERD, HTN, stroke 6 months ago (peripheral vision and balance affected)    Examination-Activity Limitations Locomotion Level;Stairs;Stand     Examination-Participation Restrictions Community Activity;Yard Work    Stability/Clinical Decision Making Evolving/Moderate complexity    Clinical Decision Making Moderate    PT Frequency Monthy    PT Duration 8 weeks    PT Treatment/Interventions ADLs/Self Care Home Management;Iontophoresis 4mg /ml Dexamethasone;Moist Heat;Traction;Cryotherapy;Gait training;Stair training;Functional mobility training;Therapeutic activities;Therapeutic exercise;Balance training;Neuromuscular re-education;Manual techniques;Patient/family education;Passive range of motion;Dry needling;Taping;Spinal Manipulations    PT Next Visit Plan . Work and . continue to review gym activity    PT Home Exercise Plan Access Code: [email or web address]    Consulted and Agree with Plan of Care Patient           Patient will benefit from skilled therapeutic intervention in order to improve the following deficits and impairments:  Difficulty walking,Increased fascial restricitons,Decreased activity tolerance,Pain,Hypomobility,Impaired flexibility,Improper body mechanics,Decreased mobility,Decreased strength  Visit Diagnosis: Chronic bilateral low back pain, unspecified whether sciatica present  Muscle weakness (generalized)  Difficulty in walking, not elsewhere classified     Problem List Patient Active Problem List   Diagnosis Date Noted  . Chronic kidney disease 06/28/2020  . Diabetes mellitus with chronic kidney disease (HCC) 06/28/2020  . Hypertension 06/28/2020  . Chronic low back pain 06/28/2020    08/28/2020 PT DPT  08/18/2020, 1:15 PM  [facility] [address] [place], [place], [place] Phone: [phone]   Fax:  [phone]  Name: Amerigo Mcglory MRN: [name] Date of Birth: 1950-12-07

## 2020-08-27 ENCOUNTER — Encounter (HOSPITAL_BASED_OUTPATIENT_CLINIC_OR_DEPARTMENT_OTHER): Payer: Medicare Other | Admitting: Physical Therapy

## 2020-08-30 ENCOUNTER — Ambulatory Visit (INDEPENDENT_AMBULATORY_CARE_PROVIDER_SITE_OTHER): Payer: Medicare Other | Admitting: Family Medicine

## 2020-08-30 ENCOUNTER — Encounter (HOSPITAL_BASED_OUTPATIENT_CLINIC_OR_DEPARTMENT_OTHER): Payer: Self-pay | Admitting: Family Medicine

## 2020-08-30 ENCOUNTER — Other Ambulatory Visit: Payer: Self-pay

## 2020-08-30 VITALS — BP 136/60 | HR 62 | Ht 69.5 in | Wt 200.3 lb

## 2020-08-30 DIAGNOSIS — N1831 Chronic kidney disease, stage 3a: Secondary | ICD-10-CM | POA: Diagnosis not present

## 2020-08-30 DIAGNOSIS — I10 Essential (primary) hypertension: Secondary | ICD-10-CM

## 2020-08-30 DIAGNOSIS — E1122 Type 2 diabetes mellitus with diabetic chronic kidney disease: Secondary | ICD-10-CM

## 2020-08-30 NOTE — Patient Instructions (Addendum)
  Medication Instructions:  Your physician recommends that you continue on your current medications as directed. Please refer to the Current Medication list given to you today. --If you need a refill on any your medications before your next appointment, please call your pharmacy first. If no refills are authorized on file call the office.--  Follow-Up: Your next appointment:   Your physician recommends that you schedule a follow-up appointment in: 1-2 WEEKS with Dr. de Peru  Thanks for letting us be apart of your health journey!!  Primary Care and Sports Medicine   Dr. de Peru and Shawna Clamp, DNP, AGNP  We recommend signing up for the patient portal called "MyChart".  Sign up information is provided on this After Visit Summary.  MyChart is used to connect with patients for Virtual Visits (Telemedicine).  Patients are able to view lab/test results, encounter notes, upcoming appointments, etc.  Non-urgent messages can be sent to your provider as well.   To learn more about what you can do with MyChart, please visit --  [email or web address].

## 2020-08-30 NOTE — Progress Notes (Signed)
    Procedures performed today:    None.  Independent interpretation of notes and tests performed by another provider:   None.  Brief History, Exam, Impression, and Recommendations:      BP 136/60   Pulse 62   Ht 5' 9.5" (1.765 m)   Wt 200 lb 4.8 oz (90.9 kg)   SpO2 98%   BMI 29.16 kg/m   Cardiovascular exam with regular rate and rhythm, no murmurs appreciated Lungs clear to auscultation bilaterally  Hypertension Blood pressure at goal today Continue with current medications Still awaiting outside records to verify dosages Encourage lifestyle modifications including DASH diet, regular exercise  Chronic kidney disease Has established with nephrology, Dr. Allena Katz Continue with follow-up, evaluation As above, awaiting records  Diabetes mellitus with chronic kidney disease (HCC) Recent hemoglobin A1c 7.2%, feel that this is adequate control given patient age and clinical picture, likely goal A1c of less than 7.5% Continue with metformin, lifestyle modifications Continue follow-up with neurology as below  Patient requesting refills on majority of medications today, uncertain of indications for some medications as well as dosage and instructions, notably rivaroxaban 15 mg twice daily.  Patient reports history of stroke a little over a year ago for which she was started on this medication without clear instructions regarding duration of treatment, adjustment in dosage, underlying etiology for reported stroke.  We will attempt to request records again to review indications for medications and to ensure proper dosage.  Also will schedule patient for visit in which she brings all medications into clinic for [place] to review dose and instructions.  Spent 30 minutes on this patient encounter, including preparation, chart review, face-to-face counseling with patient and coordination of care, and documentation of encounter  Plan for follow-up in next 1 to 2  weeks.  ___________________________________________ Volanda Mangine de Peru, MD, ABFM, CAQSM Primary Care and Sports Medicine [facility]

## 2020-08-31 NOTE — Assessment & Plan Note (Signed)
Has established with nephrology, Dr. Allena Katz Continue with follow-up, evaluation As above, awaiting records

## 2020-08-31 NOTE — Assessment & Plan Note (Signed)
Blood pressure at goal today Continue with current medications Still awaiting outside records to verify dosages Encourage lifestyle modifications including DASH diet, regular exercise

## 2020-08-31 NOTE — Assessment & Plan Note (Deleted)
Has established with nephrology, Dr. Patel Continue with follow-up, evaluation As above, awaiting records 

## 2020-08-31 NOTE — Assessment & Plan Note (Addendum)
Recent hemoglobin A1c 7.2%, feel that this is adequate control given patient age and clinical picture, likely goal A1c of less than 7.5% Continue with metformin, lifestyle modifications Continue follow-up with neurology as below

## 2020-09-02 ENCOUNTER — Other Ambulatory Visit (HOSPITAL_BASED_OUTPATIENT_CLINIC_OR_DEPARTMENT_OTHER): Payer: Self-pay

## 2020-09-03 ENCOUNTER — Telehealth (HOSPITAL_BASED_OUTPATIENT_CLINIC_OR_DEPARTMENT_OTHER): Payer: Self-pay | Admitting: Family Medicine

## 2020-09-03 ENCOUNTER — Other Ambulatory Visit (HOSPITAL_BASED_OUTPATIENT_CLINIC_OR_DEPARTMENT_OTHER): Payer: Self-pay | Admitting: Family Medicine

## 2020-09-03 ENCOUNTER — Telehealth (HOSPITAL_BASED_OUTPATIENT_CLINIC_OR_DEPARTMENT_OTHER): Payer: Self-pay

## 2020-09-03 NOTE — Telephone Encounter (Signed)
Pt came into office to coordinate med list with [facility]

## 2020-09-03 NOTE — Telephone Encounter (Signed)
Patient called requesting his medication to be refilled As discussed with the patient he will need to bring his medication in the office and have his current doses confirmed as he was unaware if the medication that are listed were correct Patient has agreed to come to the office with his medication bottles to verify medication list is accurate Upon verification medication refills can be authorized to the Outpatient Pharm at [facility]

## 2020-09-06 ENCOUNTER — Other Ambulatory Visit (HOSPITAL_BASED_OUTPATIENT_CLINIC_OR_DEPARTMENT_OTHER): Payer: Self-pay

## 2020-09-06 NOTE — Telephone Encounter (Signed)
Confirmed and made neccessary changes to make medication current in accordance to recent RX bottles that patient has.  Will defer to Dr. de Peru for refill authorization

## 2020-09-06 NOTE — Addendum Note (Signed)
Addended by: Rebbeca Paul C on: 09/06/2020 01:30 PM   Modules accepted: Orders

## 2020-09-07 ENCOUNTER — Other Ambulatory Visit (HOSPITAL_BASED_OUTPATIENT_CLINIC_OR_DEPARTMENT_OTHER): Payer: Self-pay

## 2020-09-07 MED ORDER — ROSUVASTATIN CALCIUM 20 MG PO TABS
20.0000 mg | ORAL_TABLET | Freq: Every day | ORAL | 1 refills | Status: DC
  Filled 2020-09-07: qty 30, 30d supply, fill #0

## 2020-09-07 MED ORDER — RIVAROXABAN 15 MG PO TABS
15.0000 mg | ORAL_TABLET | Freq: Every day | ORAL | 1 refills | Status: DC
  Filled 2020-09-07: qty 30, 30d supply, fill #0
  Filled 2020-10-06: qty 30, 30d supply, fill #1

## 2020-09-07 MED ORDER — METFORMIN HCL 500 MG PO TABS
500.0000 mg | ORAL_TABLET | Freq: Two times a day (BID) | ORAL | 1 refills | Status: DC
  Filled 2020-09-07: qty 60, 30d supply, fill #0
  Filled 2020-10-06: qty 60, 30d supply, fill #1

## 2020-09-07 MED ORDER — NIFEDIPINE ER OSMOTIC RELEASE 60 MG PO TB24
60.0000 mg | ORAL_TABLET | Freq: Two times a day (BID) | ORAL | 1 refills | Status: DC
  Filled 2020-09-07: qty 60, 30d supply, fill #0

## 2020-09-07 MED ORDER — ISOSORBIDE MONONITRATE ER 60 MG PO TB24
60.0000 mg | ORAL_TABLET | Freq: Every day | ORAL | 1 refills | Status: DC
  Filled 2020-09-07: qty 30, 30d supply, fill #0
  Filled 2020-10-06: qty 30, 30d supply, fill #1

## 2020-09-07 MED ORDER — OLMESARTAN MEDOXOMIL 20 MG PO TABS
20.0000 mg | ORAL_TABLET | Freq: Every day | ORAL | 1 refills | Status: DC
  Filled 2020-09-07: qty 30, 30d supply, fill #0

## 2020-09-07 MED ORDER — ASPIRIN 81 MG PO CHEW
81.0000 mg | CHEWABLE_TABLET | Freq: Every day | ORAL | 0 refills | Status: AC
  Filled 2020-09-07: qty 90, 90d supply, fill #0

## 2020-09-07 MED ORDER — TAMSULOSIN HCL 0.4 MG PO CAPS
0.4000 mg | ORAL_CAPSULE | Freq: Every day | ORAL | 1 refills | Status: DC
  Filled 2020-09-07: qty 30, 30d supply, fill #0
  Filled 2020-11-08: qty 30, 30d supply, fill #1

## 2020-09-07 NOTE — Telephone Encounter (Signed)
Pt came in to check the PCP CMA received his updated Med list - needing refills on all.

## 2020-09-08 ENCOUNTER — Other Ambulatory Visit (HOSPITAL_BASED_OUTPATIENT_CLINIC_OR_DEPARTMENT_OTHER): Payer: Self-pay

## 2020-09-13 ENCOUNTER — Encounter (HOSPITAL_BASED_OUTPATIENT_CLINIC_OR_DEPARTMENT_OTHER): Payer: Self-pay | Admitting: Physical Therapy

## 2020-09-13 ENCOUNTER — Ambulatory Visit (HOSPITAL_BASED_OUTPATIENT_CLINIC_OR_DEPARTMENT_OTHER): Payer: Medicare Other | Admitting: Family Medicine

## 2020-09-13 ENCOUNTER — Other Ambulatory Visit: Payer: Self-pay

## 2020-09-13 ENCOUNTER — Ambulatory Visit (HOSPITAL_BASED_OUTPATIENT_CLINIC_OR_DEPARTMENT_OTHER): Payer: Medicare Other | Attending: Family Medicine | Admitting: Physical Therapy

## 2020-09-13 DIAGNOSIS — M545 Low back pain, unspecified: Secondary | ICD-10-CM | POA: Diagnosis not present

## 2020-09-13 DIAGNOSIS — R262 Difficulty in walking, not elsewhere classified: Secondary | ICD-10-CM | POA: Diagnosis not present

## 2020-09-13 DIAGNOSIS — M6281 Muscle weakness (generalized): Secondary | ICD-10-CM | POA: Diagnosis not present

## 2020-09-13 DIAGNOSIS — G8929 Other chronic pain: Secondary | ICD-10-CM | POA: Diagnosis not present

## 2020-09-13 NOTE — Therapy (Signed)
[facility] [place], [place], [address] Phone: [phone]   Fax:  [phone]  Physical Therapy Treatment/Discharge   Patient Details  Name: Frederick Tyler MRN: [id number] Date of Birth: 11/09/1950 Referring Provider (PT): de [place], Blondell Reveal, MD   Encounter Date: 09/13/2020   PT End of Session - 09/13/20 1303     Visit Number 6    Number of Visits 7    Date for PT Re-Evaluation 09/13/20    PT Start Time 83    PT Stop Time 1327   Patient independent with HEP and had no further need for skilled therapy   PT Time Calculation (min) 27 min    Activity Tolerance Patient tolerated treatment well    Behavior During Therapy [facility] for tasks assessed/performed             Past Medical History:  Diagnosis Date   Chronic kidney disease    GERD (gastroesophageal reflux disease)    Hypertension     History reviewed. No pertinent surgical history.  There were no vitals filed for this visit.   Subjective Assessment - 09/13/20 1304     Subjective Patient reports no back pain today. He states he is completing exercises plan (stretches, walking, strenghtening). Patient reports he comes to the gym daily and overall he has been doing well.    Pertinent History CKD, GERD, HTN    How long can you sit comfortably? No issues.    Patient Stated Goals Exercises to help with strength.    Currently in Pain? No/denies    Pain Score 0-No pain    Pain Onset More than a month ago                [facility] PT Assessment - 09/13/20 0001       Special Tests    Special Tests Rotator Cuff Impingement                           [facility] Adult PT Treatment/Exercise - 09/13/20 0001       Ambulation/Gait   Gait Pattern Within Functional Limits    Stairs Yes    Stairs Assistance 7: Independent    Stair Management Technique Two rails      Posture/Postural Control   Posture/Postural Control No significant limitations       Self-Care   Self-Care Other Self-Care Comments    Other Self-Care Comments  reviewed how to use his program going forward, incorporated more strength  focused exercises      Therapeutic Activites    Therapeutic Activities Lifting      Exercises   Exercises Other Exercises    Other Exercises  --      Lumbar Exercises: Stretches   Lower Trunk Rotation Limitations x10 20 sec hold bilateral reports this is working well for home    Other Lumbar Stretch Exercise Reviewed lumbar stretches. Patient understand HEP.      Lumbar Exercises: Machines for Strengthening   Other Lumbar Machine Exercise machine focused chest press 30lbs - 2x10, back extension machine 25 - 2x10 with core bracing and breathing, lat pulldown 20lbs - 2x10, free weight incline chest press 10lbs -2x10 , bicep curls 5lbs-2x10                    PT Education - 09/13/20 1340     Education Details Patient educated on correct form and effects of strenght exercises. Soreness  to be expected. Patient understood exercise prescription.    Person(s) Educated Patient    Methods Demonstration;Explanation    Comprehension Verbalized understanding;Returned demonstration                 PT Long Term Goals - 09/13/20 1359       PT LONG TERM GOAL #1   Title Pt will be independent with HEP    Baseline perfroming gym activty 4-5 x a week    Time 6    Period Weeks    Status Achieved      PT LONG TERM GOAL #2   Title Pt will be able to report >50% decrease in pain when walking inclines and stairs    Baseline no pain    Time 6    Period Weeks    Status Achieved      PT LONG TERM GOAL #3   Title Pt will be able to demo improved functional hip strength by performing at least 10 squats with proper form    Baseline able to eprfrom without difficulty    Time 6    Period Weeks    Status Achieved                   Plan - 09/13/20 1344     Clinical Impression Statement Patient stated he has no  pain for couple of weeks (2-3weeks). Overall he is doing well, no compliant of pain, decreased mobility, or other complaints. Patient requested exercise prescription for strength training focused on UE (back, chest, biceps, shoulders). Patient was educated on correct form and taught how to properly set up exercises to reduce risk of injury. Patient was educated on effects of strength training such as DOMS and understood muscles will get sore after exercises. Patient is independent is cleared to return back to normal activity.  See below for goals. D/C to HEP    Personal Factors and Comorbidities Age;Fitness;Past/Current Experience;Comorbidity 1;Time since onset of injury/illness/exacerbation    Comorbidities CKD, GERD, HTN, stroke 6 months ago (peripheral vision and balance affected)    Examination-Activity Limitations Locomotion Level;Stairs;Stand    Examination-Participation Restrictions Community Activity;Yard Work    Stability/Clinical Decision Making Evolving/Moderate complexity    PT Treatment/Interventions ADLs/Self Care Home Management;Iontophoresis 20m/ml Dexamethasone;Moist Heat;Traction;Gait training;Stair training;Functional mobility training;Therapeutic activities;Therapeutic exercise;Balance training;Neuromuscular re-education;Manual techniques;Patient/family education;Passive range of motion;Dry needling;Taping;Spinal Manipulations;Cryotherapy             Patient will benefit from skilled therapeutic intervention in order to improve the following deficits and impairments:  Difficulty walking, Increased fascial restricitons, Decreased activity tolerance, Pain, Hypomobility, Impaired flexibility, Improper body mechanics, Decreased mobility, Decreased strength  Visit Diagnosis: Chronic bilateral low back pain, unspecified whether sciatica present - Plan: PT plan of care cert/re-cert  Muscle weakness (generalized) - Plan: PT plan of care cert/re-cert  Difficulty in walking, not  elsewhere classified - Plan: PT plan of care cert/re-cert   PHYSICAL THERAPY DISCHARGE SUMMARY  Visits from Start of Care: 6  Current functional level related to goals / functional outcomes: Back to the gym without pain   Remaining deficits: None   Education / Equipment: HEP    Patient agrees to discharge. Patient goals were met. Patient is being discharged due to meeting the stated rehab goals.     Problem List Patient Active Problem List   Diagnosis Date Noted   Chronic kidney disease 06/28/2020   Diabetes mellitus with chronic kidney disease ([place] 06/28/2020   Hypertension 06/28/2020   Chronic low back  pain 06/28/2020    Carney Living PT DPT  09/13/2020, 3:39 PM  [facility] [address] [place], [place], [address] Phone: [phone]   Fax:  [phone]  Name: Frederick Tyler MRN: [id number] Date of Birth: 27-Jan-1951

## 2020-10-06 ENCOUNTER — Other Ambulatory Visit (HOSPITAL_BASED_OUTPATIENT_CLINIC_OR_DEPARTMENT_OTHER): Payer: Self-pay

## 2020-10-07 ENCOUNTER — Other Ambulatory Visit (HOSPITAL_BASED_OUTPATIENT_CLINIC_OR_DEPARTMENT_OTHER): Payer: Self-pay

## 2020-10-07 ENCOUNTER — Other Ambulatory Visit (HOSPITAL_BASED_OUTPATIENT_CLINIC_OR_DEPARTMENT_OTHER): Payer: Self-pay | Admitting: Family Medicine

## 2020-10-07 MED ORDER — ATENOLOL 100 MG PO TABS
100.0000 mg | ORAL_TABLET | Freq: Two times a day (BID) | ORAL | 1 refills | Status: DC
  Filled 2020-10-07: qty 180, 90d supply, fill #0
  Filled 2020-12-28: qty 180, 90d supply, fill #1

## 2020-10-20 ENCOUNTER — Other Ambulatory Visit (HOSPITAL_BASED_OUTPATIENT_CLINIC_OR_DEPARTMENT_OTHER): Payer: Self-pay

## 2020-10-21 ENCOUNTER — Other Ambulatory Visit (HOSPITAL_BASED_OUTPATIENT_CLINIC_OR_DEPARTMENT_OTHER): Payer: Self-pay

## 2020-10-22 ENCOUNTER — Other Ambulatory Visit (HOSPITAL_BASED_OUTPATIENT_CLINIC_OR_DEPARTMENT_OTHER): Payer: Self-pay

## 2020-10-22 ENCOUNTER — Other Ambulatory Visit (HOSPITAL_BASED_OUTPATIENT_CLINIC_OR_DEPARTMENT_OTHER): Payer: Self-pay | Admitting: Family Medicine

## 2020-10-22 DIAGNOSIS — I10 Essential (primary) hypertension: Secondary | ICD-10-CM

## 2020-10-22 DIAGNOSIS — N1831 Chronic kidney disease, stage 3a: Secondary | ICD-10-CM | POA: Diagnosis not present

## 2020-10-22 MED ORDER — LOSARTAN POTASSIUM 50 MG PO TABS
50.0000 mg | ORAL_TABLET | Freq: Every day | ORAL | 1 refills | Status: DC
  Filled 2020-10-22: qty 90, 90d supply, fill #0

## 2020-10-22 NOTE — Progress Notes (Signed)
Prescription for losartan sent to pharmacy with prior prescription for olmesartan canceled.  EHR indicates that losartan is not a preferred medication based on patient's insurance, however pharmacist did complete test claim for losartan with insurance and co-pay was $0.  Prescription sent based on our documentation of losartan prescription which was verified with patient's prescription bottles brought to the office.

## 2020-10-26 DIAGNOSIS — N1831 Chronic kidney disease, stage 3a: Secondary | ICD-10-CM | POA: Diagnosis not present

## 2020-10-26 DIAGNOSIS — D631 Anemia in chronic kidney disease: Secondary | ICD-10-CM | POA: Diagnosis not present

## 2020-10-26 DIAGNOSIS — N2581 Secondary hyperparathyroidism of renal origin: Secondary | ICD-10-CM | POA: Diagnosis not present

## 2020-10-26 DIAGNOSIS — I129 Hypertensive chronic kidney disease with stage 1 through stage 4 chronic kidney disease, or unspecified chronic kidney disease: Secondary | ICD-10-CM | POA: Diagnosis not present

## 2020-11-02 ENCOUNTER — Other Ambulatory Visit (HOSPITAL_BASED_OUTPATIENT_CLINIC_OR_DEPARTMENT_OTHER): Payer: Self-pay

## 2020-11-02 MED ORDER — ROSUVASTATIN CALCIUM 20 MG PO TABS
20.0000 mg | ORAL_TABLET | Freq: Every day | ORAL | 1 refills | Status: DC
  Filled 2020-11-02: qty 90, 90d supply, fill #0

## 2020-11-03 ENCOUNTER — Other Ambulatory Visit (HOSPITAL_BASED_OUTPATIENT_CLINIC_OR_DEPARTMENT_OTHER): Payer: Self-pay

## 2020-11-03 ENCOUNTER — Other Ambulatory Visit (HOSPITAL_BASED_OUTPATIENT_CLINIC_OR_DEPARTMENT_OTHER): Payer: Self-pay | Admitting: Family Medicine

## 2020-11-03 MED ORDER — ISOSORBIDE MONONITRATE ER 60 MG PO TB24
60.0000 mg | ORAL_TABLET | Freq: Every day | ORAL | 0 refills | Status: DC
  Filled 2020-11-03: qty 90, 90d supply, fill #0

## 2020-11-03 MED ORDER — RIVAROXABAN 15 MG PO TABS
15.0000 mg | ORAL_TABLET | Freq: Every day | ORAL | 0 refills | Status: DC
  Filled 2020-11-03: qty 30, 30d supply, fill #0

## 2020-11-03 MED ORDER — FAMOTIDINE 20 MG PO TABS
40.0000 mg | ORAL_TABLET | Freq: Every day | ORAL | 0 refills | Status: DC
  Filled 2020-11-03: qty 180, 90d supply, fill #0

## 2020-11-08 ENCOUNTER — Other Ambulatory Visit (HOSPITAL_BASED_OUTPATIENT_CLINIC_OR_DEPARTMENT_OTHER): Payer: Self-pay | Admitting: Family Medicine

## 2020-11-08 ENCOUNTER — Other Ambulatory Visit (HOSPITAL_BASED_OUTPATIENT_CLINIC_OR_DEPARTMENT_OTHER): Payer: Self-pay

## 2020-11-08 MED ORDER — METFORMIN HCL 500 MG PO TABS
500.0000 mg | ORAL_TABLET | Freq: Two times a day (BID) | ORAL | 0 refills | Status: DC
  Filled 2020-11-08: qty 60, 30d supply, fill #0

## 2020-11-08 NOTE — Telephone Encounter (Signed)
Patient is overdue for follow up Patient schedule d to follow up in June and cancelled appointment Will authorize 30 day supply, patient will need to complete follow up to receive future refill authorizations.

## 2020-11-09 ENCOUNTER — Other Ambulatory Visit (HOSPITAL_BASED_OUTPATIENT_CLINIC_OR_DEPARTMENT_OTHER): Payer: Self-pay

## 2020-11-11 ENCOUNTER — Encounter (HOSPITAL_BASED_OUTPATIENT_CLINIC_OR_DEPARTMENT_OTHER): Payer: Self-pay

## 2020-11-11 NOTE — Progress Notes (Signed)
Received records from Dr. Allena Katz at [facility] Patient was seen on 08/08 and losartan was increased from 50 mg QD to 50 mg BID.  Dr. Allena Katz authorized 1 year supply of medication Will update patient records

## 2020-11-23 ENCOUNTER — Other Ambulatory Visit (HOSPITAL_BASED_OUTPATIENT_CLINIC_OR_DEPARTMENT_OTHER): Payer: Self-pay

## 2020-11-23 ENCOUNTER — Other Ambulatory Visit: Payer: Self-pay

## 2020-11-23 ENCOUNTER — Encounter (HOSPITAL_BASED_OUTPATIENT_CLINIC_OR_DEPARTMENT_OTHER): Payer: Self-pay | Admitting: Family Medicine

## 2020-11-23 ENCOUNTER — Ambulatory Visit (INDEPENDENT_AMBULATORY_CARE_PROVIDER_SITE_OTHER): Payer: Medicare Other | Admitting: Family Medicine

## 2020-11-23 VITALS — BP 134/70 | HR 64 | Ht 69.5 in | Wt 199.6 lb

## 2020-11-23 DIAGNOSIS — I10 Essential (primary) hypertension: Secondary | ICD-10-CM

## 2020-11-23 DIAGNOSIS — E1122 Type 2 diabetes mellitus with diabetic chronic kidney disease: Secondary | ICD-10-CM | POA: Diagnosis not present

## 2020-11-23 DIAGNOSIS — R809 Proteinuria, unspecified: Secondary | ICD-10-CM

## 2020-11-23 DIAGNOSIS — N1831 Chronic kidney disease, stage 3a: Secondary | ICD-10-CM | POA: Diagnosis not present

## 2020-11-23 MED ORDER — NIFEDIPINE ER OSMOTIC RELEASE 60 MG PO TB24
60.0000 mg | ORAL_TABLET | Freq: Two times a day (BID) | ORAL | 1 refills | Status: DC
  Filled 2020-11-23: qty 180, 90d supply, fill #0
  Filled 2021-02-03 – 2021-02-25 (×2): qty 180, 90d supply, fill #1

## 2020-11-23 MED ORDER — ISOSORBIDE MONONITRATE ER 60 MG PO TB24
60.0000 mg | ORAL_TABLET | Freq: Every day | ORAL | 1 refills | Status: DC
  Filled 2020-11-23 – 2021-02-11 (×2): qty 90, 90d supply, fill #0
  Filled 2021-05-12: qty 90, 90d supply, fill #1

## 2020-11-23 MED ORDER — IRON (FERROUS SULFATE) 325 (65 FE) MG PO TABS
ORAL_TABLET | ORAL | 1 refills | Status: DC
  Filled 2020-11-23: qty 45, 90d supply, fill #0

## 2020-11-23 MED ORDER — TAMSULOSIN HCL 0.4 MG PO CAPS
0.4000 mg | ORAL_CAPSULE | Freq: Every day | ORAL | 1 refills | Status: DC
  Filled 2020-11-23 – 2020-12-14 (×2): qty 90, 90d supply, fill #0
  Filled 2021-03-28: qty 90, 90d supply, fill #1

## 2020-11-23 MED ORDER — RIVAROXABAN 15 MG PO TABS
15.0000 mg | ORAL_TABLET | Freq: Every day | ORAL | 1 refills | Status: DC
  Filled 2020-11-23: qty 90, 90d supply, fill #0
  Filled 2020-11-26: qty 30, 30d supply, fill #0
  Filled 2021-01-12: qty 30, 30d supply, fill #1
  Filled 2021-03-28: qty 30, 30d supply, fill #2
  Filled 2021-04-25: qty 30, 30d supply, fill #3
  Filled 2021-06-08: qty 30, 30d supply, fill #4
  Filled 2021-07-04: qty 30, 30d supply, fill #5

## 2020-11-23 MED ORDER — ROSUVASTATIN CALCIUM 20 MG PO TABS
20.0000 mg | ORAL_TABLET | Freq: Every day | ORAL | 1 refills | Status: DC
  Filled 2020-11-23 – 2021-02-28 (×2): qty 90, 90d supply, fill #0
  Filled 2021-05-25: qty 90, 90d supply, fill #1

## 2020-11-23 MED ORDER — METFORMIN HCL 500 MG PO TABS
500.0000 mg | ORAL_TABLET | Freq: Two times a day (BID) | ORAL | 1 refills | Status: DC
  Filled 2020-11-23 – 2020-12-14 (×2): qty 180, 90d supply, fill #0
  Filled 2021-03-11: qty 180, 90d supply, fill #1

## 2020-11-23 MED ORDER — LOSARTAN POTASSIUM 50 MG PO TABS
50.0000 mg | ORAL_TABLET | Freq: Two times a day (BID) | ORAL | 1 refills | Status: DC
  Filled 2020-11-23 (×2): qty 180, 90d supply, fill #0
  Filled 2021-02-25: qty 180, 90d supply, fill #1

## 2020-11-23 NOTE — Assessment & Plan Note (Signed)
Blood pressure at goal in office today Denies any issues with chest pain, headaches Requesting refill of medications Will continue with current regimen Encourage lifestyle modifications including DASH diet, regular exercise

## 2020-11-23 NOTE — Progress Notes (Signed)
    Procedures performed today:    None.  Independent interpretation of notes and tests performed by another provider:   None.  Brief History, Exam, Impression, and Recommendations:    BP 134/70   Pulse 64   Ht 5' 9.5" (1.765 m)   Wt 199 lb 9.6 oz (90.5 kg)   SpO2 98%   BMI 29.05 kg/m   Hypertension Blood pressure at goal in office today Denies any issues with chest pain, headaches Requesting refill of medications Will continue with current regimen Encourage lifestyle modifications including DASH diet, regular exercise  Diabetes mellitus with chronic kidney disease (HCC) Most recent hemoglobin A1c was 7.2% about 4 months ago Due for recheck Denies any symptomatic hypoglycemia Will refill metformin, encouraged to continue with lifestyle modifications Had labs completed with nephrology, reviewed these but hemoglobin A1c was not checked Will have patient complete hemoglobin A1c, likely in office point-of-care testing  Moderately increased albuminuria Found to have increased albuminuria on initial labs about 4 months ago.  Repeat testing with nephrology shows persistent elevation in albuminuria Has been started on ARB therapy with nephrology, will continue with this  Spent 30 minutes on this patient encounter, including preparation, chart review, face-to-face counseling with patient and coordination of care, and documentation of encounter.  Reviewed most recent evaluation with nephrology as well as recent laboratory studies completed by nephrology.  Plan for follow-up in about 2 months or sooner as needed   ___________________________________________ Frederick Pinkham de Peru, MD, ABFM, [facility] Primary Care and Sports Medicine [facility]

## 2020-11-23 NOTE — Assessment & Plan Note (Signed)
Found to have increased albuminuria on initial labs about 4 months ago.  Repeat testing with nephrology shows persistent elevation in albuminuria Has been started on ARB therapy with nephrology, will continue with this

## 2020-11-23 NOTE — Patient Instructions (Signed)
  Medication Instructions:  Your physician recommends that you continue on your current medications as directed. Please refer to the Current Medication list given to you today. --If you need a refill on any your medications before your next appointment, please call your pharmacy first. If no refills are authorized on file call the office.-- Follow-Up: Your next appointment:   Your physician recommends that you schedule a follow-up appointment in: 2 MONTHS with Dr. de [place]  Thanks for letting us be apart of your health journey!!  Primary Care and Sports Medicine   Dr. Raymond de Cuba   We encourage you to activate your patient portal called "MyChart".  Sign up information is provided on this After Visit Summary.  MyChart is used to connect with patients for Virtual Visits (Telemedicine).  Patients are able to view lab/test results, encounter notes, upcoming appointments, etc.  Non-urgent messages can be sent to your provider as well. To learn more about what you can do with MyChart, please visit --  [email or web address].    

## 2020-11-23 NOTE — Assessment & Plan Note (Signed)
Most recent hemoglobin A1c was 7.2% about 4 months ago Due for recheck Denies any symptomatic hypoglycemia Will refill metformin, encouraged to continue with lifestyle modifications Had labs completed with nephrology, reviewed these but hemoglobin A1c was not checked Will have patient complete hemoglobin A1c, likely in office point-of-care testing

## 2020-11-24 ENCOUNTER — Other Ambulatory Visit (HOSPITAL_BASED_OUTPATIENT_CLINIC_OR_DEPARTMENT_OTHER): Payer: Self-pay

## 2020-11-26 ENCOUNTER — Other Ambulatory Visit (HOSPITAL_BASED_OUTPATIENT_CLINIC_OR_DEPARTMENT_OTHER): Payer: Self-pay

## 2020-12-14 ENCOUNTER — Other Ambulatory Visit (HOSPITAL_BASED_OUTPATIENT_CLINIC_OR_DEPARTMENT_OTHER): Payer: Self-pay

## 2020-12-23 ENCOUNTER — Ambulatory Visit (INDEPENDENT_AMBULATORY_CARE_PROVIDER_SITE_OTHER): Payer: Medicare Other | Admitting: Nurse Practitioner

## 2020-12-23 ENCOUNTER — Other Ambulatory Visit (HOSPITAL_BASED_OUTPATIENT_CLINIC_OR_DEPARTMENT_OTHER): Payer: Self-pay

## 2020-12-23 ENCOUNTER — Other Ambulatory Visit: Payer: Self-pay

## 2020-12-23 ENCOUNTER — Encounter (HOSPITAL_BASED_OUTPATIENT_CLINIC_OR_DEPARTMENT_OTHER): Payer: Self-pay | Admitting: Nurse Practitioner

## 2020-12-23 VITALS — BP 151/69 | HR 74 | Ht 70.5 in | Wt 195.0 lb

## 2020-12-23 DIAGNOSIS — J0140 Acute pansinusitis, unspecified: Secondary | ICD-10-CM | POA: Diagnosis not present

## 2020-12-23 MED ORDER — AMOXICILLIN-POT CLAVULANATE 875-125 MG PO TABS
1.0000 | ORAL_TABLET | Freq: Two times a day (BID) | ORAL | 0 refills | Status: DC
  Filled 2020-12-23: qty 10, 5d supply, fill #0

## 2020-12-23 NOTE — Progress Notes (Signed)
Virtual Visit Encounter telephone visit.   I connected with  [name] on 12/23/20 at  2:10 PM EDT by secure audio and/or video enabled telemedicine application. I verified that I am speaking with the correct person using two identifiers.   I introduced myself as a Publishing rights manager with the practice. The limitations of evaluation and management by telemedicine discussed with the patient and the availability of in person appointments. The patient expressed verbal understanding and consent to proceed.  Participating parties in this visit include: Myself and patient  The patient is: Patient Location: Home I am: Provider Location: Office/Clinic Subjective:    CC and HPI: Junious Ragone is a 70 y.o. year old male presenting for new evaluation and treatment of cold symptoms that started 9 days ago. He tells me his daughter came to visit and she had a cold, which he feels that she gave to him.  He endorses symptoms starting with a sore throat and cough. He reports that initially the cough was dry, but he could hear the mucous rattle in his chest. After 4-5 days he tells me that the cough became productive and he has been coughing up discolored mucous.  He is also experiencing sinus pain and pressure above and below his eyes.  He endorses an on and off elevation in temperature (99 degrees) that is responsive to tylenol.  He has also been using ricola cough drops, lemon water, and vicks vapo rub  He has no ShOB, CP, palpitations.  He is unable to take typical OTC antihistamines due to prostate enlargement so he has been avoiding these.  He tells me a few years ago he had walking pneumonia and he is concerned this will turn into that without treatment.   Past medical history, Surgical history, Family history not pertinant except as noted below, Social history, Allergies, and medications have been entered into the medical record, reviewed, and corrections made.   Review of Systems:  All review of  systems negative except what is listed in the HPI  Objective:    Alert and oriented x 4 Speaking in clear sentences with no shortness of breath. No distress. Congested sounding.   Impression and Recommendations:    Problem List Items Addressed This Visit     Acute non-recurrent pansinusitis - Primary    Symptoms consistent with acute sinusitis.  Symptoms present for 9 days at this time with worsening symptoms.  Concern is present with increased mucous production and cough. Recommend deep breathing and cough to help clear airways.  Treatment sent with augmentin for 5 days. Patient instructed to contact the office on Monday if his symptoms are not improving.  Will consider CXR and sputum culture at that time.       Relevant Medications   amoxicillin-clavulanate (AUGMENTIN) 875-125 MG tablet    orders and follow up as documented in EMR I discussed the assessment and treatment plan with the patient. The patient was provided an opportunity to ask questions and all were answered. The patient agreed with the plan and demonstrated an understanding of the instructions.   The patient was advised to call back or seek an in-person evaluation if the symptoms worsen or if the condition fails to improve as anticipated.  Follow-Up: prn  I provided 15 minutes of non-face-to-face interaction with this non face-to-face encounter including intake, same-day documentation, and chart review.   Tollie Eth, NP , DNP, AGNP-c [facility] Primary Care & Sports Medicine at [facility] [phone] (fax)

## 2020-12-23 NOTE — Patient Instructions (Signed)
Moist, warm air can be helpful to break up the mucous in your lungs. You can get this through a humidifier or standing in the bathroom with a hot shower running.   Be sure you stay well hydrated and rest as much as possible.  Continue to take the Tylenol for fever and pain. Do not take more than 3 grams in a 24 hour period.  If your symptoms do not improve, or improve then get worse again, please let us know immediately so we can evaluate you further.   I hope you feel better soon.

## 2020-12-23 NOTE — Assessment & Plan Note (Signed)
Symptoms consistent with acute sinusitis.  Symptoms present for 9 days at this time with worsening symptoms.  Concern is present with increased mucous production and cough. Recommend deep breathing and cough to help clear airways.  Treatment sent with augmentin for 5 days. Patient instructed to contact the office on Monday if his symptoms are not improving.  Will consider CXR and sputum culture at that time.

## 2020-12-28 ENCOUNTER — Other Ambulatory Visit (HOSPITAL_BASED_OUTPATIENT_CLINIC_OR_DEPARTMENT_OTHER): Payer: Self-pay

## 2020-12-28 ENCOUNTER — Other Ambulatory Visit: Payer: Self-pay

## 2020-12-31 ENCOUNTER — Encounter (HOSPITAL_BASED_OUTPATIENT_CLINIC_OR_DEPARTMENT_OTHER): Payer: Self-pay

## 2020-12-31 ENCOUNTER — Other Ambulatory Visit: Payer: Self-pay

## 2020-12-31 ENCOUNTER — Ambulatory Visit (INDEPENDENT_AMBULATORY_CARE_PROVIDER_SITE_OTHER): Payer: Medicare Other | Admitting: Family Medicine

## 2020-12-31 ENCOUNTER — Other Ambulatory Visit (HOSPITAL_BASED_OUTPATIENT_CLINIC_OR_DEPARTMENT_OTHER): Payer: Self-pay

## 2020-12-31 ENCOUNTER — Emergency Department (HOSPITAL_BASED_OUTPATIENT_CLINIC_OR_DEPARTMENT_OTHER): Payer: Medicare Other | Admitting: Radiology

## 2020-12-31 ENCOUNTER — Emergency Department (HOSPITAL_BASED_OUTPATIENT_CLINIC_OR_DEPARTMENT_OTHER)
Admission: EM | Admit: 2020-12-31 | Discharge: 2020-12-31 | Disposition: A | Discharge status: Home / Self Care | Payer: Medicare Other | Attending: Emergency Medicine | Admitting: Emergency Medicine

## 2020-12-31 DIAGNOSIS — I129 Hypertensive chronic kidney disease with stage 1 through stage 4 chronic kidney disease, or unspecified chronic kidney disease: Secondary | ICD-10-CM | POA: Diagnosis not present

## 2020-12-31 DIAGNOSIS — Z79899 Other long term (current) drug therapy: Secondary | ICD-10-CM | POA: Insufficient documentation

## 2020-12-31 DIAGNOSIS — E1122 Type 2 diabetes mellitus with diabetic chronic kidney disease: Secondary | ICD-10-CM | POA: Insufficient documentation

## 2020-12-31 DIAGNOSIS — R059 Cough, unspecified: Secondary | ICD-10-CM | POA: Insufficient documentation

## 2020-12-31 DIAGNOSIS — J3489 Other specified disorders of nose and nasal sinuses: Secondary | ICD-10-CM | POA: Diagnosis not present

## 2020-12-31 DIAGNOSIS — D72829 Elevated white blood cell count, unspecified: Secondary | ICD-10-CM | POA: Diagnosis not present

## 2020-12-31 DIAGNOSIS — R051 Acute cough: Secondary | ICD-10-CM | POA: Diagnosis not present

## 2020-12-31 DIAGNOSIS — R062 Wheezing: Secondary | ICD-10-CM | POA: Insufficient documentation

## 2020-12-31 DIAGNOSIS — R509 Fever, unspecified: Secondary | ICD-10-CM | POA: Diagnosis not present

## 2020-12-31 DIAGNOSIS — R070 Pain in throat: Secondary | ICD-10-CM | POA: Diagnosis not present

## 2020-12-31 DIAGNOSIS — Z7901 Long term (current) use of anticoagulants: Secondary | ICD-10-CM | POA: Diagnosis not present

## 2020-12-31 DIAGNOSIS — N189 Chronic kidney disease, unspecified: Secondary | ICD-10-CM | POA: Diagnosis not present

## 2020-12-31 DIAGNOSIS — R0789 Other chest pain: Secondary | ICD-10-CM | POA: Insufficient documentation

## 2020-12-31 DIAGNOSIS — Z7984 Long term (current) use of oral hypoglycemic drugs: Secondary | ICD-10-CM | POA: Insufficient documentation

## 2020-12-31 DIAGNOSIS — R0602 Shortness of breath: Secondary | ICD-10-CM | POA: Diagnosis not present

## 2020-12-31 DIAGNOSIS — Z87891 Personal history of nicotine dependence: Secondary | ICD-10-CM | POA: Insufficient documentation

## 2020-12-31 DIAGNOSIS — Z7982 Long term (current) use of aspirin: Secondary | ICD-10-CM | POA: Insufficient documentation

## 2020-12-31 LAB — CBC WITH DIFFERENTIAL/PLATELET
Abs Immature Granulocytes: 0.04 K/uL (ref 0.00–0.07)
Basophils Absolute: 0.1 K/uL (ref 0.0–0.1)
Basophils Relative: 0 %
Eosinophils Absolute: 0.7 K/uL — ABNORMAL HIGH (ref 0.0–0.5)
Eosinophils Relative: 5 %
HCT: 41.3 % (ref 39.0–52.0)
Hemoglobin: 13.5 g/dL (ref 13.0–17.0)
Immature Granulocytes: 0 %
Lymphocytes Relative: 12 %
Lymphs Abs: 1.7 K/uL (ref 0.7–4.0)
MCH: 28.8 pg (ref 26.0–34.0)
MCHC: 32.7 g/dL (ref 30.0–36.0)
MCV: 88.1 fL (ref 80.0–100.0)
Monocytes Absolute: 1.3 K/uL — ABNORMAL HIGH (ref 0.1–1.0)
Monocytes Relative: 10 %
Neutro Abs: 10 K/uL — ABNORMAL HIGH (ref 1.7–7.7)
Neutrophils Relative %: 73 %
Platelets: 171 K/uL (ref 150–400)
RBC: 4.69 MIL/uL (ref 4.22–5.81)
RDW: 14 % (ref 11.5–15.5)
WBC: 13.8 K/uL — ABNORMAL HIGH (ref 4.0–10.5)
nRBC: 0 % (ref 0.0–0.2)

## 2020-12-31 LAB — COMPREHENSIVE METABOLIC PANEL WITH GFR
ALT: 13 U/L (ref 0–44)
AST: 14 U/L — ABNORMAL LOW (ref 15–41)
Albumin: 4.5 g/dL (ref 3.5–5.0)
Alkaline Phosphatase: 48 U/L (ref 38–126)
Anion gap: 13 (ref 5–15)
BUN: 27 mg/dL — ABNORMAL HIGH (ref 8–23)
CO2: 21 mmol/L — ABNORMAL LOW (ref 22–32)
Calcium: 9.6 mg/dL (ref 8.9–10.3)
Chloride: 100 mmol/L (ref 98–111)
Creatinine, Ser: 1.34 mg/dL — ABNORMAL HIGH (ref 0.61–1.24)
GFR, Estimated: 57 mL/min — ABNORMAL LOW (ref >60)
Glucose, Bld: 180 mg/dL — ABNORMAL HIGH (ref 70–99)
Potassium: 4.3 mmol/L (ref 3.5–5.1)
Sodium: 134 mmol/L — ABNORMAL LOW (ref 135–145)
Total Bilirubin: 0.6 mg/dL (ref 0.3–1.2)
Total Protein: 7.8 g/dL (ref 6.5–8.1)

## 2020-12-31 LAB — TROPONIN I (HIGH SENSITIVITY): Troponin I (High Sensitivity): 17 ng/L (ref <18)

## 2020-12-31 MED ORDER — PREDNISONE 50 MG PO TABS
50.0000 mg | ORAL_TABLET | Freq: Every day | ORAL | 0 refills | Status: DC
  Filled 2020-12-31: qty 4, 4d supply, fill #0

## 2020-12-31 MED ORDER — ALBUTEROL SULFATE HFA 108 (90 BASE) MCG/ACT IN AERS
1.0000 | INHALATION_SPRAY | Freq: Four times a day (QID) | RESPIRATORY_TRACT | 0 refills | Status: DC | PRN
  Filled 2020-12-31: qty 8.5, 25d supply, fill #0

## 2020-12-31 MED ORDER — LEVOFLOXACIN 500 MG PO TABS
500.0000 mg | ORAL_TABLET | Freq: Every day | ORAL | 0 refills | Status: DC
  Filled 2020-12-31: qty 7, 7d supply, fill #0

## 2020-12-31 MED ORDER — PREDNISONE 50 MG PO TABS
60.0000 mg | ORAL_TABLET | Freq: Once | ORAL | Status: AC
  Administered 2020-12-31: 60 mg via ORAL
  Filled 2020-12-31: qty 1

## 2020-12-31 MED ORDER — BENZONATATE 100 MG PO CAPS
100.0000 mg | ORAL_CAPSULE | Freq: Three times a day (TID) | ORAL | 0 refills | Status: DC
  Filled 2020-12-31: qty 21, 7d supply, fill #0

## 2020-12-31 MED ORDER — IPRATROPIUM-ALBUTEROL 0.5-2.5 (3) MG/3ML IN SOLN
3.0000 mL | Freq: Once | RESPIRATORY_TRACT | Status: AC
  Administered 2020-12-31: 3 mL via RESPIRATORY_TRACT
  Filled 2020-12-31: qty 3

## 2020-12-31 NOTE — Progress Notes (Signed)
Upon assessment of patient he was given a breathing treatment due to his SOB and increased WOB. He tolerated the neb treatment well. Patient stated he has a HX smoking. RT will continue to monitor

## 2020-12-31 NOTE — Progress Notes (Signed)
   Virtual Visit via Telephone   I connected with  Frederick Tyler  on 12/31/20 by telephone and verified that I am speaking with the correct person using two identifiers.   I discussed the limitations, risks, security and privacy concerns of performing an evaluation and management service by telephone, including the higher likelihood of inaccurate diagnosis and treatment, and the availability of in person appointments.  We also discussed the likely need of an additional face to face encounter for complete and high quality delivery of care.  I also discussed with the patient that there may be a patient responsible charge related to this service. The patient expressed understanding and wishes to proceed.  Provider location is in medical facility. Patient location is at their home, different from provider location. People involved in care of the patient during this telehealth encounter were myself, my nurse/medical assistant, and my front office/scheduling team member.  Review of Systems: No fevers, chills, night sweats, weight loss, chest pain, or shortness of breath.   Objective Findings:    General: Speaking full sentences, no audible heavy breathing.  Sounds alert and appropriately interactive.    Independent interpretation of tests performed by another provider:   None.  Brief History, Exam, Impression, and Recommendations:    Cough Patient reports that he has had symptoms for 2 to 3 weeks now.  Initially began with Sore throat and cough.  Cough initially was dry and he had some associated chest congestion, subsequently began to cough up discolored mucus.  He also had some sinus pressure/pain.  No true fever with highest recorded reading of 99 degrees.  Patient has been trying over-the-counter measures.  He was also assessed with virtual visit with [name] through our office and was prescribed a course of Augmentin for treatment of sinusitis.  Patient did complete course but continues  to have productive cough, mild shortness of breath, dizziness with exertion. Discussed with patient that given past medical history, age, current symptoms, would recommend further evaluation in person, specifically at urgent care or emergency department.  Specifically feel that patient would need to have vitals checked, oxygen saturation assessed, consideration of labs and possible imaging before proceeding with additional pharmacotherapy.  Discussed this with patient and wife who is also on the phone call.  Patient voiced understanding and indicated that he will decide on which urgent care or emergency department to go to for further evaluation.  I discussed the above assessment and treatment plan with the patient. The patient was provided an opportunity to ask questions and all were answered. The patient agreed with the plan and demonstrated an understanding of the instructions.   The patient was advised to call back or seek an in-person evaluation if the symptoms worsen or if the condition fails to improve as anticipated.   I provided 20 minutes of face to face and non-face-to-face time during this encounter date, time was needed to gather information, review chart, records, communicate/coordinate with staff remotely, as well as complete documentation.   ___________________________________________ Lezlee Gills de [place], MD, ABFM, CAQSM Primary Care and Sports Medicine [facility]

## 2020-12-31 NOTE — ED Triage Notes (Signed)
A week of congestion and cough.

## 2020-12-31 NOTE — Assessment & Plan Note (Signed)
Patient reports that he has had symptoms for 2 to 3 weeks now.  Initially began with Sore throat and cough.  Cough initially was dry and he had some associated chest congestion, subsequently began to cough up discolored mucus.  He also had some sinus pressure/pain.  No true fever with highest recorded reading of 99 degrees.  Patient has been trying over-the-counter measures.  He was also assessed with virtual visit with [name] through our office and was prescribed a course of Augmentin for treatment of sinusitis.  Patient did complete course but continues to have productive cough, mild shortness of breath, dizziness with exertion. Discussed with patient that given past medical history, age, current symptoms, would recommend further evaluation in person, specifically at urgent care or emergency department.  Specifically feel that patient would need to have vitals checked, oxygen saturation assessed, consideration of labs and possible imaging before proceeding with additional pharmacotherapy.  Discussed this with patient and wife who is also on the phone call.  Patient voiced understanding and indicated that he will decide on which urgent care or emergency department to go to for further evaluation.

## 2020-12-31 NOTE — ED Provider Notes (Addendum)
[facility] EMERGENCY DEPT Provider Note   CSN: [id number] Arrival date & time: 12/31/20  1227     History Chief Complaint  Patient presents with   Shortness of Breath    Frederick Tyler is a 70 y.o. male with past medical history significant for TIA, CKD, hypertension, diabetes who presents for evaluation of upper respiratory complaints.  Symptoms x2 to 3 weeks.  Was seen by PCP prescribed Augmentin.  Symptoms do not improve.  Did a telehealth visit today, recommended further evaluation here in emergency department.  Patient with cough productive of yellow/green sputum, scratchy throat, intermittent low-grade fevers up to 99, rhinorrhea, congestion, shortness of breath and chest tightness with cough. No exertional or pleuritic CP. No LE edema, PND, orthopnea. On DOAC for recurrent TIA. No missed doses. No HA, CP, unilateral weakness, abd pain, diarrhea, urinary complaints.  Does have prior smoking history.  States previously used inhalers when he was a tobacco user however no formal diagnosis of COPD.  He feels like he may been diagnosed with asthma many years ago however stopping smoking he has not had any issues.  Significant other in room without any similar complaints or illnesses.  No recent COVID exposures.  Rates current pain a 0/10.  Denies additional aggravating or alleviating factors.  Patient with some wheeze and diminished breath sounds at bases per Respiratory, Got a Duoneb and feels improved.   History obtained from patient, family, past medical records.  No interpreter is used.    HPI     Past Medical History:  Diagnosis Date   Chronic kidney disease    GERD (gastroesophageal reflux disease)    Hypertension     Patient Active Problem List   Diagnosis Date Noted   Cough 12/31/2020   Acute non-recurrent pansinusitis 12/23/2020   Moderately increased albuminuria 11/23/2020   Chronic kidney disease 06/28/2020   Diabetes mellitus with chronic kidney  disease (HCC) 06/28/2020   Hypertension 06/28/2020   Chronic low back pain 06/28/2020    History reviewed. No pertinent surgical history.     Family History  Family history unknown: Yes    Social History   Tobacco Use   Smoking status: Former    Types: Cigarettes   Smokeless tobacco: Never  Vaping Use   Vaping Use: Never used  Substance Use Topics   Alcohol use: Never   Drug use: Never    Home Medications Prior to Admission medications   Medication Sig Start Date End Date Taking? Authorizing Provider  albuterol (VENTOLIN HFA) 108 (90 Base) MCG/ACT inhaler Inhale 1-2 puffs into the lungs every 6 (six) hours as needed for wheezing or shortness of breath. 12/31/20  Yes Quisha Mabie A, PA-C  benzonatate (TESSALON) 100 MG capsule Take 1 capsule (100 mg total) by mouth every 8 (eight) hours. 12/31/20  Yes Alyss Granato A, PA-C  levofloxacin (LEVAQUIN) 500 MG tablet Take 1 tablet (500 mg total) by mouth daily. 12/31/20  Yes Shelagh Rayman A, PA-C  predniSONE (DELTASONE) 50 MG tablet Take 1 tablet (50 mg total) by mouth daily. 12/31/20  Yes Ciria Bernardini A, PA-C  amoxicillin-clavulanate (AUGMENTIN) 875-125 MG tablet Take 1 tablet by mouth 2 (two) times daily. 12/23/20   Tollie Eth, NP  aspirin 81 MG chewable tablet Chew 1 tablet (81 mg total) by mouth daily. 09/07/20   [place], Buren Kos, MD  atenolol (TENORMIN) 100 MG tablet Take 1 tablet (100 mg total) by mouth 2 (two) times daily. 10/07/20   [place], Raymond  J, MD  famotidine (PEPCID) 20 MG tablet Take 2 tablets (40 mg total) by mouth at bedtime. 11/03/20   [place], Raymond J, MD  Iron, Ferrous Sulfate, 325 (65 Fe) MG TABS Take 1 tablet by mouth on Monday, Wednesday, and Friday 11/23/20   [place], Buren Kos, MD  isosorbide mononitrate (IMDUR) 60 MG 24 hr tablet Take 1 tablet (60 mg total) by mouth daily. 11/23/20   [place], Buren Kos, MD  losartan (COZAAR) 50 MG tablet Take 1 tablet (50 mg total) by mouth in the morning and at  bedtime. 11/23/20   [place], Buren Kos, MD  metFORMIN (GLUCOPHAGE) 500 MG tablet Take 1 tablet (500 mg total) by mouth 2 (two) times daily with a meal. 11/23/20   [place], Buren Kos, MD  NIFEdipine (PROCARDIA XL/NIFEDICAL XL) 60 MG 24 hr tablet Take 1 tablet (60 mg total) by mouth in the morning and at bedtime. 11/23/20   [place], Buren Kos, MD  nitroGLYCERIN (NITROSTAT) 0.4 MG SL tablet Place 0.4 mg under the tongue every 5 (five) minutes as needed for chest pain (max dose of 3. At initiation of 3rd dose call 911.).    [provider]  Probiotic Product (PROBIOTIC BLEND PO) Take by mouth. NuZymes    [provider]  Rivaroxaban (XARELTO) 15 MG TABS tablet Take 1 tablet (15 mg total) by mouth daily with supper. 11/23/20   [place], Buren Kos, MD  rosuvastatin (CRESTOR) 20 MG tablet Take 1 tablet (20 mg total) by mouth daily. 11/23/20   [place], Buren Kos, MD  tamsulosin (FLOMAX) 0.4 MG CAPS capsule Take 1 capsule (0.4 mg total) by mouth daily after supper. 11/23/20   [place], Raymond J, MD    Allergies    Patient has no known allergies.  Review of Systems   Review of Systems  Constitutional:  Positive for fatigue and fever (Max 99.0).  HENT:  Positive for congestion, postnasal drip, rhinorrhea, sinus pressure and sore throat. Negative for ear pain, facial swelling, trouble swallowing and voice change.   Respiratory:  Positive for cough, chest tightness and shortness of breath. Negative for apnea, choking, wheezing and stridor.   Cardiovascular: Negative.   Gastrointestinal: Negative.   Genitourinary: Negative.   Musculoskeletal: Negative.   Skin: Negative.   Neurological: Negative.   All other systems reviewed and are negative.  Physical Exam Updated Vital Signs BP (!) 141/71   Pulse 70   Temp 98.2 F (36.8 C) (Oral)   Resp (!) 22   Ht 5\' 10"  (1.778 m)   Wt 88.5 kg   SpO2 97%   BMI 27.98 kg/m   Physical Exam Vitals and nursing note reviewed.  Constitutional:       General: He is not in acute distress.    Appearance: He is well-developed. He is not ill-appearing, toxic-appearing or diaphoretic.  HENT:     Head: Normocephalic and atraumatic.     Mouth/Throat:     Mouth: Mucous membranes are moist.     Comments: Posterior oropharynx is clear.  Uvula midline.  No evidence of PTA or RPA.  No pooling of secretions Eyes:     Pupils: Pupils are equal, round, and reactive to light.  Neck:     Comments: No neck stiffness or neck rigidity.  Full range of motion without difficulty Cardiovascular:     Rate and Rhythm: Normal rate and regular rhythm.     Pulses: Normal pulses.     Heart  sounds: Normal heart sounds.  Pulmonary:     Effort: Pulmonary effort is normal. No respiratory distress.     Breath sounds: Normal breath sounds.     Comments: Diminished at bases however speaks in full sentences without difficulty.  No gross wheeze, rhonchi or rales. Chest:     Comments: Mild tenderness however no crepitus or step-off Abdominal:     General: Bowel sounds are normal. There is no distension.     Palpations: Abdomen is soft.     Comments: Soft, nontender without rebound or guarding  Musculoskeletal:        General: Normal range of motion.     Cervical back: Normal range of motion and neck supple.     Right lower leg: No tenderness. No edema.     Left lower leg: No tenderness. No edema.     Comments: No bony tenderness.  Compartments are soft.  Full range of motion without difficulty.  No lower extremity edema  Skin:    General: Skin is warm and dry.     Capillary Refill: Capillary refill takes less than 2 seconds.     Comments: No rashes or lesions  Neurological:     General: No focal deficit present.     Mental Status: He is alert and oriented to person, place, and time.     Comments: Cranial nerves II to XII grossly intact Ambulatory without difficulty    ED Results / Procedures / Treatments   Labs (all labs ordered are listed, but only abnormal  results are displayed) Labs Reviewed  CBC WITH DIFFERENTIAL/PLATELET - Abnormal; Notable for the following components:      Result Value   WBC 13.8 (*)    Neutro Abs 10.0 (*)    Monocytes Absolute 1.3 (*)    Eosinophils Absolute 0.7 (*)    All other components within normal limits  COMPREHENSIVE METABOLIC PANEL - Abnormal; Notable for the following components:   Sodium 134 (*)    CO2 21 (*)    Glucose, Bld 180 (*)    BUN 27 (*)    Creatinine, Ser 1.34 (*)    AST 14 (*)    GFR, Estimated 57 (*)    All other components within normal limits  TROPONIN I (HIGH SENSITIVITY)    EKG None  Radiology DG Chest 2 View  Result Date: 12/31/2020 CLINICAL DATA:  Cough for 3 weeks, smoker EXAM: CHEST - 2 VIEW COMPARISON:  None. FINDINGS: Normal heart size. Normal mediastinal contour. No pneumothorax. No pleural effusion. Lungs appear clear, with no acute consolidative airspace disease and no pulmonary edema. IMPRESSION: No active cardiopulmonary disease. Electronically Signed   By: Delbert Phenix M.D.   On: 12/31/2020 13:42    Procedures Procedures   Medications Ordered in ED Medications  ipratropium-albuterol (DUONEB) 0.5-2.5 (3) MG/3ML nebulizer solution 3 mL (3 mLs Nebulization Given 12/31/20 1245)  ipratropium-albuterol (DUONEB) 0.5-2.5 (3) MG/3ML nebulizer solution 3 mL (3 mLs Nebulization Given 12/31/20 1332)  predniSONE (DELTASONE) tablet 60 mg (60 mg Oral Given 12/31/20 1400)   ED Course  I have reviewed the triage vital signs and the nursing notes.  Pertinent labs & imaging results that were available during my care of the patient were reviewed by me and considered in my medical decision making (see chart for details).  Here for evaluation of upper respiratory complaints which may be ongoing over the last 2 to 3 weeks.  On arrival he is afebrile, nonseptic, not ill-appearing.  Has a nonfocal  neuro exam without deficits.  Posterior oropharynx clear.  No evidence of PTA or RPA.  He has no  neck stiffness or neck rigidity to suggest meningitis.  No evidence of otitis.  His heart is clear.  He does have some diminished sounds at his bases however speaks in full sentences without difficulty.  Has some clear rhinorrhea, some mild chest tenderness with palpation.  Abdomen soft, nontender.  No clinical evidence of fluid overload, VTE.  Plan on labs, imaging and reassess.  Was given DuoNeb by respiratory which patient is states significantly helped with his symptoms  Labs and imaging personally reviewed and interpreted:  CBC leukocytosis at 13.8 CMP sodium 134, glucose 180, creatinine 1.34 similar to previous Trop 17 DG chest without infiltrates, edema, cardiomegaly EKG with PVC, no ischemic changes  Patient reassessed. Feels improved after breathing treatment. Ambulatory without hypoxia or DOE. Suspect bronchitis, given duration of sx started on abx, steroids and inhaler. Will have FU with PCP. Discussed close monitoring of CBG at home and dc if CBG >250. He voiced understanding.   At this time I have low suspicion for ACS, PE, dissection, pneumothorax, sepsis  The patient has been appropriately medically screened and/or stabilized in the ED. I have low suspicion for any other emergent medical condition which would require further screening, evaluation or treatment in the ED or require inpatient management.  Patient is hemodynamically stable and in no acute distress.  Patient able to ambulate in department prior to ED.  Evaluation does not show acute pathology that would require ongoing or additional emergent interventions while in the emergency department or further inpatient treatment.  I have discussed the diagnosis with the patient and answered all questions.  Pain is been managed while in the emergency department and patient has no further complaints prior to discharge.  Patient is comfortable with plan discussed in room and is stable for discharge at this time.  I have discussed strict  return precautions for returning to the emergency department.  Patient was encouraged to follow-up with PCP/specialist refer to at discharge.   Discussed with attending Dr. Jodi Mourning who is in agreement with above plan and disposition.     MDM Rules/Calculators/A&P                           Giavanni Odonovan was evaluated in Emergency Department on 12/31/2020 for the symptoms described in the history of present illness. He was evaluated in the context of the global COVID-19 pandemic, which necessitated consideration that the patient might be at risk for infection with the SARS-CoV-2 virus that causes COVID-19. Institutional protocols and algorithms that pertain to the evaluation of patients at risk for COVID-19 are in a state of rapid change based on information released by regulatory bodies including the CDC and federal and state organizations. These policies and algorithms were followed during the patient's care in the ED.  Final Clinical Impression(s) / ED Diagnoses Final diagnoses:  Acute cough  Wheeze    Rx / DC Orders ED Discharge Orders          Ordered    albuterol (VENTOLIN HFA) 108 (90 Base) MCG/ACT inhaler  Every 6 hours PRN        12/31/20 1450    benzonatate (TESSALON) 100 MG capsule  Every 8 hours        12/31/20 1450    levofloxacin (LEVAQUIN) 500 MG tablet  Daily        12/31/20 1450  predniSONE (DELTASONE) 50 MG tablet  Daily        12/31/20 1450                 Nadie Fiumara A, PA-C 12/31/20 1502    Blane Ohara, MD 01/01/21 2253

## 2020-12-31 NOTE — Discharge Instructions (Addendum)
I am treating you for bronchitis. Take the medications as prescribed.  Stop taking the Prednisone if your blood sugars are greater than 250. Start taking this medication tomorrow morning.  Tessalon pearls for cough Levaquin - Antibiotics Albuterol inhaler for shortness of breath  Follow up with your primary care provider in 2-3 days for re-evaluation  Return for new or worsening symptoms

## 2021-01-12 ENCOUNTER — Other Ambulatory Visit (HOSPITAL_BASED_OUTPATIENT_CLINIC_OR_DEPARTMENT_OTHER): Payer: Self-pay

## 2021-01-17 ENCOUNTER — Other Ambulatory Visit (HOSPITAL_BASED_OUTPATIENT_CLINIC_OR_DEPARTMENT_OTHER): Payer: Self-pay

## 2021-01-17 ENCOUNTER — Ambulatory Visit (INDEPENDENT_AMBULATORY_CARE_PROVIDER_SITE_OTHER): Payer: Medicare Other | Admitting: Nurse Practitioner

## 2021-01-17 ENCOUNTER — Other Ambulatory Visit: Payer: Self-pay

## 2021-01-17 ENCOUNTER — Encounter (HOSPITAL_BASED_OUTPATIENT_CLINIC_OR_DEPARTMENT_OTHER): Payer: Self-pay | Admitting: Nurse Practitioner

## 2021-01-17 VITALS — BP 138/70 | HR 65 | Ht 70.0 in | Wt 198.2 lb

## 2021-01-17 DIAGNOSIS — J40 Bronchitis, not specified as acute or chronic: Secondary | ICD-10-CM | POA: Diagnosis not present

## 2021-01-17 DIAGNOSIS — J329 Chronic sinusitis, unspecified: Secondary | ICD-10-CM

## 2021-01-17 MED ORDER — TRELEGY ELLIPTA 100-62.5-25 MCG/ACT IN AEPB: 1.0000 | INHALATION_SPRAY | Freq: Every day | RESPIRATORY_TRACT | 0 refills | Status: DC

## 2021-01-17 MED ORDER — AZITHROMYCIN 250 MG PO TABS
ORAL_TABLET | ORAL | 0 refills | Status: AC
Start: 2021-01-17 — End: 2021-01-22
  Filled 2021-01-17: qty 6, 5d supply, fill #0

## 2021-01-17 MED ORDER — DEXAMETHASONE 4 MG PO TABS
4.0000 mg | ORAL_TABLET | Freq: Two times a day (BID) | ORAL | 0 refills | Status: DC
  Filled 2021-01-17: qty 10, 5d supply, fill #0

## 2021-01-17 NOTE — Progress Notes (Signed)
Established Patient Office Visit  Subjective:  Patient ID: Frederick Tyler, male    DOB: January 05, 1951  Age: 70 y.o. MRN: [id number]  CC:  Chief Complaint  Patient presents with   Bronchitis    Patient was seen in the ED on 10/7 for URI symptoms - dx with bronchitis. Patient was treated with levofloxacin, tessalon, albuterol, and prednisone which he has completed. He is still having chest congestion, shortness of breath, cough, and mucus in his chest which seems to worsen at night. Patient tested twice at home for covid and negative. CXR was completed in ED    HPI Frederick Tyler presents for cough, shortness of breath, sinus pain and pressure, and increased mucous production that has been present for about 6 + weeks now. He has been treated with augmentin and levofloxacin, tessalon, albuterol, and prednisone. He has had some improvement in his symptoms, but still experiencing worsening shortness of breath, especially in the morning and increased mucous production. He is a former smoker, no diagnosis of COPD. He is not currently having a fever, but does endorse chills.  He has had a CXR in the ED and has been negative for COVID. His granddaughter is 21 months old and has had similar symptoms.    ROS Review of Systems All review of systems negative except what is listed in the HPI    Objective:    Physical Exam Vitals and nursing note reviewed.  Constitutional:      Appearance: He is ill-appearing.  HENT:     Head: Normocephalic.     Nose: Congestion and rhinorrhea present.     Mouth/Throat:     Mouth: Mucous membranes are moist.     Pharynx: Oropharynx is clear. Posterior oropharyngeal erythema present.  Eyes:     Extraocular Movements: Extraocular movements intact.     Conjunctiva/sclera: Conjunctivae normal.     Pupils: Pupils are equal, round, and reactive to light.  Neck:     Vascular: No carotid bruit.  Cardiovascular:     Rate and Rhythm: Normal rate and regular rhythm.      Pulses: Normal pulses.     Heart sounds: Normal heart sounds.  Pulmonary:     Effort: Pulmonary effort is normal.     Breath sounds: Rhonchi present.  Abdominal:     General: Bowel sounds are normal.     Palpations: Abdomen is soft.  Musculoskeletal:        General: Normal range of motion.     Right lower leg: No edema.     Left lower leg: No edema.  Lymphadenopathy:     Cervical: Cervical adenopathy present.  Skin:    General: Skin is warm and dry.     Capillary Refill: Capillary refill takes less than 2 seconds.  Neurological:     General: No focal deficit present.     Mental Status: He is alert and oriented to person, place, and time.  Psychiatric:        Mood and Affect: Mood normal.        Behavior: Behavior normal.        Thought Content: Thought content normal.        Judgment: Judgment normal.    BP 138/70   Pulse 65   Ht 5\' 10"  (1.778 m)   Wt 198 lb 3.2 oz (89.9 kg)   SpO2 98%   BMI 28.44 kg/m  Wt Readings from Last 3 Encounters:  01/17/21 198 lb 3.2 oz (89.9 kg)  12/31/20  195 lb (88.5 kg)  12/23/20 195 lb (88.5 kg)    There are no preventive care reminders to display for this patient.  No results found for: TSH Lab Results  Component Value Date   WBC 13.8 (H) 12/31/2020   HGB 13.5 12/31/2020   HCT 41.3 12/31/2020   MCV 88.1 12/31/2020   PLT 171 12/31/2020   Lab Results  Component Value Date   NA 134 (L) 12/31/2020   K 4.3 12/31/2020   CO2 21 (L) 12/31/2020   GLUCOSE 180 (H) 12/31/2020   BUN 27 (H) 12/31/2020   CREATININE 1.34 (H) 12/31/2020   BILITOT 0.6 12/31/2020   ALKPHOS 48 12/31/2020   AST 14 (L) 12/31/2020   ALT 13 12/31/2020   PROT 7.8 12/31/2020   ALBUMIN 4.5 12/31/2020   CALCIUM 9.6 12/31/2020   ANIONGAP 13 12/31/2020   Lab Results  Component Value Date   CHOL 105 06/28/2020   Lab Results  Component Value Date   HDL 44 06/28/2020   Lab Results  Component Value Date   LDLCALC 14 06/28/2020   Lab Results  Component  Value Date   TRIG 237 (H) 06/28/2020   Lab Results  Component Value Date   CHOLHDL 2.4 06/28/2020   Lab Results  Component Value Date   HGBA1C 7.2 (H) 06/28/2020      Assessment & Plan:   Problem List Items Addressed This Visit     Sinobronchitis - Primary    Continued symptoms of infection present with rhonchi and shortness of breath, worse in the morning.  Sinuses are tender on palpation.  No diagnosis of COPD, but based on smoking history, strongly suspect this is present and underlying.  Will begin tentative treatment with azithromycin for atypical and dexamethasone burst. Will also add 14 days of Trelegy Ellipta for to see if this is helpful for symptoms.  Patient educated on when to return.  If he is not better with this round, recommend referral to pulmonology for further evaluation.       Relevant Medications   dexamethasone (DECADRON) 4 MG tablet   azithromycin (ZITHROMAX) 250 MG tablet   Fluticasone-Umeclidin-Vilant (TRELEGY ELLIPTA) 100-62.5-25 MCG/ACT AEPB    Meds ordered this encounter  Medications   dexamethasone (DECADRON) 4 MG tablet    Sig: Take 1 tablet (4 mg total) by mouth 2 (two) times daily with a meal.    Dispense:  10 tablet    Refill:  0   azithromycin (ZITHROMAX) 250 MG tablet    Sig: Take 2 tablets on day 1, then 1 tablet daily on days 2 through 5    Dispense:  6 tablet    Refill:  0   Fluticasone-Umeclidin-Vilant (TRELEGY ELLIPTA) 100-62.5-25 MCG/ACT AEPB    Sig: Inhale 1 puff into the lungs daily.    Dispense:  1 each    Refill:  0    Follow-up: Return if symptoms worsen or fail to improve.    Tollie Eth, NP

## 2021-01-17 NOTE — Patient Instructions (Addendum)
I recommend taking Mucinex DM (can be purchased at the pharmacy over the counter) to help with the increased mucous and when you do cough it helps make the cough more productive.   Make sure you are staying well hydrated- lots of water will help the mucinex work best.   I have sent in a short steroid burst to help bring the inflammation down for you.  I have also sent in a different antibiotic to see if we can get this cleared up.   The inhaler that I gave you you will use once a day to help with bronchitis. You will use this for 14 days.

## 2021-01-18 DIAGNOSIS — J329 Chronic sinusitis, unspecified: Secondary | ICD-10-CM | POA: Insufficient documentation

## 2021-01-18 DIAGNOSIS — J40 Bronchitis, not specified as acute or chronic: Secondary | ICD-10-CM | POA: Insufficient documentation

## 2021-01-18 NOTE — Assessment & Plan Note (Signed)
Continued symptoms of infection present with rhonchi and shortness of breath, worse in the morning.  Sinuses are tender on palpation.  No diagnosis of COPD, but based on smoking history, strongly suspect this is present and underlying.  Will begin tentative treatment with azithromycin for atypical and dexamethasone burst. Will also add 14 days of Trelegy Ellipta for to see if this is helpful for symptoms.  Patient educated on when to return.  If he is not better with this round, recommend referral to pulmonology for further evaluation.

## 2021-01-21 ENCOUNTER — Other Ambulatory Visit: Payer: Self-pay

## 2021-01-24 ENCOUNTER — Ambulatory Visit (HOSPITAL_BASED_OUTPATIENT_CLINIC_OR_DEPARTMENT_OTHER): Payer: Medicare Other | Admitting: Family Medicine

## 2021-02-03 ENCOUNTER — Encounter (HOSPITAL_BASED_OUTPATIENT_CLINIC_OR_DEPARTMENT_OTHER): Payer: Self-pay | Admitting: Family Medicine

## 2021-02-03 ENCOUNTER — Ambulatory Visit (INDEPENDENT_AMBULATORY_CARE_PROVIDER_SITE_OTHER): Payer: Medicare Other | Admitting: Family Medicine

## 2021-02-03 ENCOUNTER — Other Ambulatory Visit: Payer: Self-pay

## 2021-02-03 ENCOUNTER — Other Ambulatory Visit (HOSPITAL_BASED_OUTPATIENT_CLINIC_OR_DEPARTMENT_OTHER): Payer: Self-pay

## 2021-02-03 DIAGNOSIS — H539 Unspecified visual disturbance: Secondary | ICD-10-CM | POA: Insufficient documentation

## 2021-02-03 NOTE — Assessment & Plan Note (Signed)
Over the past 3 days, patient has noticed that primarily in the mornings, when looking at bright objects or bright light, he will have flashes in his vision, slight headache associated with this.  He feels that symptoms are not as prominent as a day goes on or in the evening.  Denies any other symptoms such as numbness, tingling, weakness, gait disturbance.  No trouble with speech. On exam, extraocular movements intact, pupils are equal, round and reactive to light and accommodation.  On pupillary reflex testing, patient with mild bright spots in his vision shortly after, no significant pain elicited though and he reports his symptoms are generally mild compared to what they are like for second the morning Discussed with patient that I do not feel that there is acute concern for current TIA or stroke syndrome.  However, do recommend evaluation with ophthalmology for further testing, recommendations Referral to ophthalmology placed We will plan for follow-up in about 2 to 3 months or sooner as needed

## 2021-02-03 NOTE — Patient Instructions (Signed)
  Medication Instructions:  Your physician recommends that you continue on your current medications as directed. Please refer to the Current Medication list given to you today. --If you need a refill on any your medications before your next appointment, please call your pharmacy first. If no refills are authorized on file call the office.--  Referrals/Procedures/Imaging: A referral has been placed for you to an Ophthalmologist for evaluation and treatment. Someone from the scheduling department will be in contact with you in regards to coordinating your consultation. If you do not hear from any of the schedulers within 7-10 business days please give their office a call.  Follow-Up: Your next appointment:   Your physician recommends that you schedule a follow-up appointment in: 2-3 MONTHS with Dr. de Peru  You will receive a text message or e-mail with a link to a survey about your care and experience with [place] today! We would greatly appreciate your feedback!   Thanks for letting us be apart of your health journey!!  Primary Care and Sports Medicine   Dr. Ceasar Mons Peru   We encourage you to activate your patient portal called "MyChart".  Sign up information is provided on this After Visit Summary.  MyChart is used to connect with patients for Virtual Visits (Telemedicine).  Patients are able to view lab/test results, encounter notes, upcoming appointments, etc.  Non-urgent messages can be sent to your provider as well. To learn more about what you can do with MyChart, please visit --  [email or web address].

## 2021-02-03 NOTE — Progress Notes (Signed)
    Procedures performed today:    None.  Independent interpretation of notes and tests performed by another provider:   None.  Brief History, Exam, Impression, and Recommendations:    BP 136/66   Pulse 65   Ht 5\' 10"  (1.778 m)   Wt 196 lb (88.9 kg)   SpO2 99%   BMI 28.12 kg/m   Visual disturbance Over the past 3 days, patient has noticed that primarily in the mornings, when looking at bright objects or bright light, he will have flashes in his vision, slight headache associated with this.  He feels that symptoms are not as prominent as a day goes on or in the evening.  Denies any other symptoms such as numbness, tingling, weakness, gait disturbance.  No trouble with speech. On exam, extraocular movements intact, pupils are equal, round and reactive to light and accommodation.  On pupillary reflex testing, patient with mild bright spots in his vision shortly after, no significant pain elicited though and he reports his symptoms are generally mild compared to what they are like for second the morning Discussed with patient that I do not feel that there is acute concern for current TIA or stroke syndrome.  However, do recommend evaluation with ophthalmology for further testing, recommendations Referral to ophthalmology placed We will plan for follow-up in about 2 to 3 months or sooner as needed   ___________________________________________ Frederick Tyler de , MD, ABFM, [facility] Primary Care and Sports Medicine [facility]

## 2021-02-04 ENCOUNTER — Other Ambulatory Visit (HOSPITAL_BASED_OUTPATIENT_CLINIC_OR_DEPARTMENT_OTHER): Payer: Self-pay

## 2021-02-04 ENCOUNTER — Other Ambulatory Visit (HOSPITAL_BASED_OUTPATIENT_CLINIC_OR_DEPARTMENT_OTHER): Payer: Self-pay | Admitting: Family Medicine

## 2021-02-04 MED ORDER — FAMOTIDINE 20 MG PO TABS
40.0000 mg | ORAL_TABLET | Freq: Every day | ORAL | 0 refills | Status: DC
  Filled 2021-02-04: qty 180, 90d supply, fill #0

## 2021-02-07 ENCOUNTER — Other Ambulatory Visit (HOSPITAL_BASED_OUTPATIENT_CLINIC_OR_DEPARTMENT_OTHER): Payer: Self-pay

## 2021-02-08 DIAGNOSIS — H26493 Other secondary cataract, bilateral: Secondary | ICD-10-CM | POA: Diagnosis not present

## 2021-02-08 DIAGNOSIS — H5310 Unspecified subjective visual disturbances: Secondary | ICD-10-CM | POA: Diagnosis not present

## 2021-02-08 DIAGNOSIS — E119 Type 2 diabetes mellitus without complications: Secondary | ICD-10-CM | POA: Diagnosis not present

## 2021-02-09 ENCOUNTER — Other Ambulatory Visit (HOSPITAL_BASED_OUTPATIENT_CLINIC_OR_DEPARTMENT_OTHER): Payer: Self-pay

## 2021-02-09 MED ORDER — INFLUENZA VAC A&B SA ADJ QUAD 0.5 ML IM PRSY
PREFILLED_SYRINGE | INTRAMUSCULAR | 0 refills | Status: DC
  Filled 2021-02-09: qty 0.5, 1d supply, fill #0

## 2021-02-11 ENCOUNTER — Other Ambulatory Visit (HOSPITAL_BASED_OUTPATIENT_CLINIC_OR_DEPARTMENT_OTHER): Payer: Self-pay

## 2021-02-25 ENCOUNTER — Other Ambulatory Visit (HOSPITAL_BASED_OUTPATIENT_CLINIC_OR_DEPARTMENT_OTHER): Payer: Self-pay

## 2021-02-28 ENCOUNTER — Other Ambulatory Visit (HOSPITAL_BASED_OUTPATIENT_CLINIC_OR_DEPARTMENT_OTHER): Payer: Self-pay

## 2021-03-11 ENCOUNTER — Other Ambulatory Visit (HOSPITAL_BASED_OUTPATIENT_CLINIC_OR_DEPARTMENT_OTHER): Payer: Self-pay

## 2021-03-23 DIAGNOSIS — H26492 Other secondary cataract, left eye: Secondary | ICD-10-CM | POA: Diagnosis not present

## 2021-03-28 ENCOUNTER — Other Ambulatory Visit (HOSPITAL_BASED_OUTPATIENT_CLINIC_OR_DEPARTMENT_OTHER): Payer: Self-pay | Admitting: Family Medicine

## 2021-03-28 ENCOUNTER — Other Ambulatory Visit (HOSPITAL_BASED_OUTPATIENT_CLINIC_OR_DEPARTMENT_OTHER): Payer: Self-pay

## 2021-03-28 MED ORDER — ATENOLOL 100 MG PO TABS
100.0000 mg | ORAL_TABLET | Freq: Two times a day (BID) | ORAL | 1 refills | Status: DC
  Filled 2021-03-28: qty 180, 90d supply, fill #0
  Filled 2021-06-15: qty 180, 90d supply, fill #1

## 2021-03-29 ENCOUNTER — Other Ambulatory Visit (HOSPITAL_BASED_OUTPATIENT_CLINIC_OR_DEPARTMENT_OTHER): Payer: Self-pay

## 2021-04-06 DIAGNOSIS — H26491 Other secondary cataract, right eye: Secondary | ICD-10-CM | POA: Diagnosis not present

## 2021-04-11 ENCOUNTER — Other Ambulatory Visit (HOSPITAL_BASED_OUTPATIENT_CLINIC_OR_DEPARTMENT_OTHER): Payer: Self-pay

## 2021-04-21 ENCOUNTER — Other Ambulatory Visit (HOSPITAL_BASED_OUTPATIENT_CLINIC_OR_DEPARTMENT_OTHER): Payer: Self-pay

## 2021-04-25 ENCOUNTER — Other Ambulatory Visit (HOSPITAL_BASED_OUTPATIENT_CLINIC_OR_DEPARTMENT_OTHER): Payer: Self-pay

## 2021-05-04 ENCOUNTER — Other Ambulatory Visit (HOSPITAL_BASED_OUTPATIENT_CLINIC_OR_DEPARTMENT_OTHER): Payer: Self-pay | Admitting: Family Medicine

## 2021-05-04 ENCOUNTER — Other Ambulatory Visit (HOSPITAL_BASED_OUTPATIENT_CLINIC_OR_DEPARTMENT_OTHER): Payer: Self-pay

## 2021-05-04 MED ORDER — FAMOTIDINE 20 MG PO TABS
40.0000 mg | ORAL_TABLET | Freq: Every day | ORAL | 0 refills | Status: DC
  Filled 2021-05-04: qty 180, 90d supply, fill #0

## 2021-05-05 ENCOUNTER — Other Ambulatory Visit (HOSPITAL_BASED_OUTPATIENT_CLINIC_OR_DEPARTMENT_OTHER): Payer: Self-pay

## 2021-05-05 ENCOUNTER — Other Ambulatory Visit: Payer: Self-pay

## 2021-05-05 ENCOUNTER — Ambulatory Visit (INDEPENDENT_AMBULATORY_CARE_PROVIDER_SITE_OTHER): Payer: Medicare Other | Admitting: Family Medicine

## 2021-05-05 ENCOUNTER — Encounter (HOSPITAL_BASED_OUTPATIENT_CLINIC_OR_DEPARTMENT_OTHER): Payer: Self-pay | Admitting: Family Medicine

## 2021-05-05 VITALS — BP 122/62 | HR 67 | Ht 70.0 in | Wt 201.7 lb

## 2021-05-05 DIAGNOSIS — R051 Acute cough: Secondary | ICD-10-CM | POA: Diagnosis not present

## 2021-05-05 NOTE — Patient Instructions (Signed)
°  Medication Instructions:  Your physician recommends that you continue on your current medications as directed. Please refer to the Current Medication list given to you today. --If you need a refill on any your medications before your next appointment, please call your pharmacy first. If no refills are authorized on file call the office.--  Referrals/Procedures/Imaging: Your physician recommends that you have an XRAY of your CHEST. X-rays are a type of radiation called electromagnetic waves. X-ray imaging creates pictures of the inside of your body. The images show the parts of your body in different shades of black and white. This is because different tissues absorb different amounts of radiation. Calcium in bones absorbs x-rays the most, so bones look white. Fat and other soft tissues absorb less and look gray. Air absorbs the least, so lungs look black     1. You may have this done at the [facility], located in the [facility] Building on the 1st floor.    2. You do no have to have an appointment.    3. [address] [place], [place] [address]        [phone]        Monday - Friday  8:00 am - 5:00 pm 4. [facility]  [facility]. [facility] is a premier diagnostic imaging center that offers a 64-slice CT scanner and interventional radiology services.   Follow-Up: Your next appointment:   Your physician recommends that you schedule a follow-up appointment in: 2-3 MONTHS with Dr. de Peru  You will receive a text message or e-mail with a link to a survey about your care and experience with [place] today! We would greatly appreciate your feedback!   Thanks for letting us be apart of your health journey!!  Primary Care and Sports Medicine   Dr. Ceasar Mons Peru   We encourage you to activate your patient portal called "MyChart".  Sign up information is provided on this After  Visit Summary.  MyChart is used to connect with patients for Virtual Visits (Telemedicine).  Patients are able to view lab/test results, encounter notes, upcoming appointments, etc.  Non-urgent messages can be sent to your provider as well. To learn more about what you can do with MyChart, please visit --  [email or web address].

## 2021-05-05 NOTE — Progress Notes (Signed)
° ° °  Procedures performed today:    None.  Independent interpretation of notes and tests performed by another provider:   None.  Brief History, Exam, Impression, and Recommendations:    BP 122/62    Pulse 67    Ht 5\' 10"  (1.778 m)    Wt 201 lb 11.2 oz (91.5 kg)    SpO2 99%    BMI 28.94 kg/m   Cough Patient reports that about 3 weeks ago he began having URI symptoms including cough, congestion, fatigue.  Generally symptoms have been improving, however he continues to have cough, feelings of chest congestion, intermittent shortness of breath, primarily with coughing spells.  Cough is occasionally productive.  He did complete at home COVID testing which was negative, however his wife did test positive and she was sick around the same time as patient. He has been trying Vicks, cough drops, has also been using albuterol inhaler which he feels has been helpful.  No recent fevers.  Primary concern is for lingering cough. On exam, cardiovascular Sam with regular rate and rhythm.  Lungs largely clear to auscultation bilaterally, occasional wheezing on exam. Discussed with patient that, particularly with viral URI, despite improvement in other symptoms, cough can linger for up to 4 to 6 weeks after infection.  Suspect that current issue is most likely related to postviral cough Given presence of some wheezing and continued cough, will check chest x-ray to evaluate further At this time, will manage symptomatically, can continue with OTC measures, can utilize honey to help with cough If abnormality observed on chest x-ray, will treat accordingly Plan for follow-up in about 2 months to follow-up on chronic medical issues   ___________________________________________ Debralee Braaksma de , MD, ABFM, CAQSM Primary Care and Sports Medicine [facility]

## 2021-05-05 NOTE — Assessment & Plan Note (Signed)
Patient reports that about 3 weeks ago he began having URI symptoms including cough, congestion, fatigue.  Generally symptoms have been improving, however he continues to have cough, feelings of chest congestion, intermittent shortness of breath, primarily with coughing spells.  Cough is occasionally productive.  He did complete at home COVID testing which was negative, however his wife did test positive and she was sick around the same time as patient. He has been trying Vicks, cough drops, has also been using albuterol inhaler which he feels has been helpful.  No recent fevers.  Primary concern is for lingering cough. On exam, cardiovascular Sam with regular rate and rhythm.  Lungs largely clear to auscultation bilaterally, occasional wheezing on exam. Discussed with patient that, particularly with viral URI, despite improvement in other symptoms, cough can linger for up to 4 to 6 weeks after infection.  Suspect that current issue is most likely related to postviral cough Given presence of some wheezing and continued cough, will check chest x-ray to evaluate further At this time, will manage symptomatically, can continue with OTC measures, can utilize honey to help with cough If abnormality observed on chest x-ray, will treat accordingly Plan for follow-up in about 2 months to follow-up on chronic medical issues

## 2021-05-06 ENCOUNTER — Ambulatory Visit
Admission: RE | Admit: 2021-05-06 | Discharge: 2021-05-06 | Disposition: A | Discharge status: Home / Self Care | Payer: Medicare Other | Source: Ambulatory Visit | Attending: Family Medicine | Admitting: Family Medicine

## 2021-05-06 DIAGNOSIS — R051 Acute cough: Secondary | ICD-10-CM

## 2021-05-06 DIAGNOSIS — R059 Cough, unspecified: Secondary | ICD-10-CM | POA: Diagnosis not present

## 2021-05-12 ENCOUNTER — Other Ambulatory Visit (HOSPITAL_BASED_OUTPATIENT_CLINIC_OR_DEPARTMENT_OTHER): Payer: Self-pay

## 2021-05-16 ENCOUNTER — Other Ambulatory Visit (HOSPITAL_BASED_OUTPATIENT_CLINIC_OR_DEPARTMENT_OTHER): Payer: Self-pay

## 2021-05-25 ENCOUNTER — Other Ambulatory Visit (HOSPITAL_BASED_OUTPATIENT_CLINIC_OR_DEPARTMENT_OTHER): Payer: Self-pay | Admitting: Family Medicine

## 2021-05-25 ENCOUNTER — Other Ambulatory Visit (HOSPITAL_BASED_OUTPATIENT_CLINIC_OR_DEPARTMENT_OTHER): Payer: Self-pay

## 2021-05-25 MED ORDER — NIFEDIPINE ER OSMOTIC RELEASE 60 MG PO TB24
60.0000 mg | ORAL_TABLET | Freq: Two times a day (BID) | ORAL | 0 refills | Status: DC
  Filled 2021-05-25: qty 180, 90d supply, fill #0

## 2021-05-30 ENCOUNTER — Other Ambulatory Visit (HOSPITAL_BASED_OUTPATIENT_CLINIC_OR_DEPARTMENT_OTHER): Payer: Self-pay

## 2021-06-01 ENCOUNTER — Other Ambulatory Visit (HOSPITAL_BASED_OUTPATIENT_CLINIC_OR_DEPARTMENT_OTHER): Payer: Self-pay

## 2021-06-01 ENCOUNTER — Other Ambulatory Visit (HOSPITAL_BASED_OUTPATIENT_CLINIC_OR_DEPARTMENT_OTHER): Payer: Self-pay | Admitting: Family Medicine

## 2021-06-01 DIAGNOSIS — I10 Essential (primary) hypertension: Secondary | ICD-10-CM

## 2021-06-01 MED ORDER — LOSARTAN POTASSIUM 50 MG PO TABS
50.0000 mg | ORAL_TABLET | Freq: Two times a day (BID) | ORAL | 1 refills | Status: DC
  Filled 2021-06-01: qty 180, 90d supply, fill #0
  Filled 2021-08-31: qty 180, 90d supply, fill #1

## 2021-06-08 ENCOUNTER — Other Ambulatory Visit (HOSPITAL_BASED_OUTPATIENT_CLINIC_OR_DEPARTMENT_OTHER): Payer: Self-pay

## 2021-06-08 ENCOUNTER — Other Ambulatory Visit (HOSPITAL_BASED_OUTPATIENT_CLINIC_OR_DEPARTMENT_OTHER): Payer: Self-pay | Admitting: Family Medicine

## 2021-06-08 MED ORDER — METFORMIN HCL 500 MG PO TABS
500.0000 mg | ORAL_TABLET | Freq: Two times a day (BID) | ORAL | 1 refills | Status: DC
  Filled 2021-06-08: qty 180, 90d supply, fill #0
  Filled 2021-09-07: qty 180, 90d supply, fill #1

## 2021-06-15 ENCOUNTER — Other Ambulatory Visit (HOSPITAL_BASED_OUTPATIENT_CLINIC_OR_DEPARTMENT_OTHER): Payer: Self-pay

## 2021-06-16 ENCOUNTER — Other Ambulatory Visit (HOSPITAL_BASED_OUTPATIENT_CLINIC_OR_DEPARTMENT_OTHER): Payer: Self-pay

## 2021-06-19 IMAGING — US US RENAL
1 series · 14 of 25 positions shown · non-contrast
Comparison: None.

CLINICAL DATA: Chronic kidney disease stage 3 a.

EXAM:
RENAL / URINARY TRACT ULTRASOUND COMPLETE

[Series 1: us renal · 0.23mm/px · 14 of 35 slices shown]
[im 1/35]
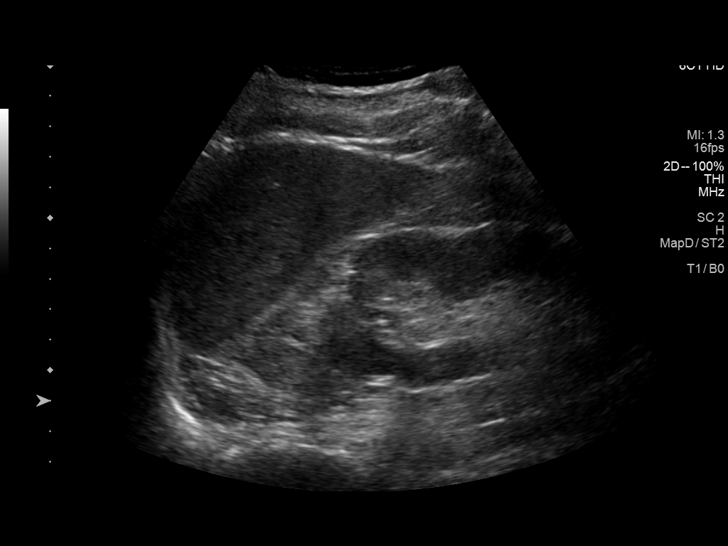
[im 3/35]
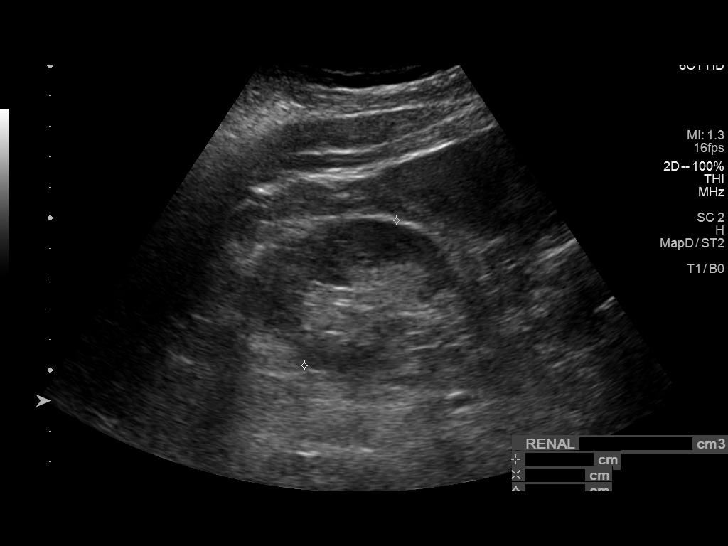
[im 6/35]
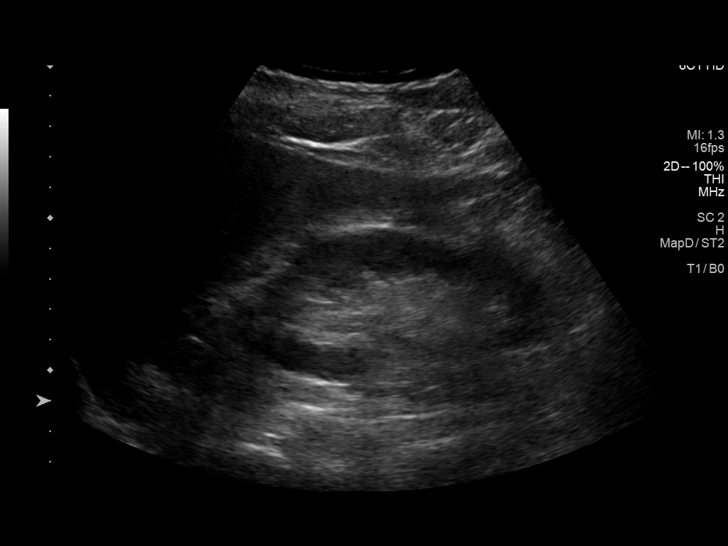
[im 9/35]
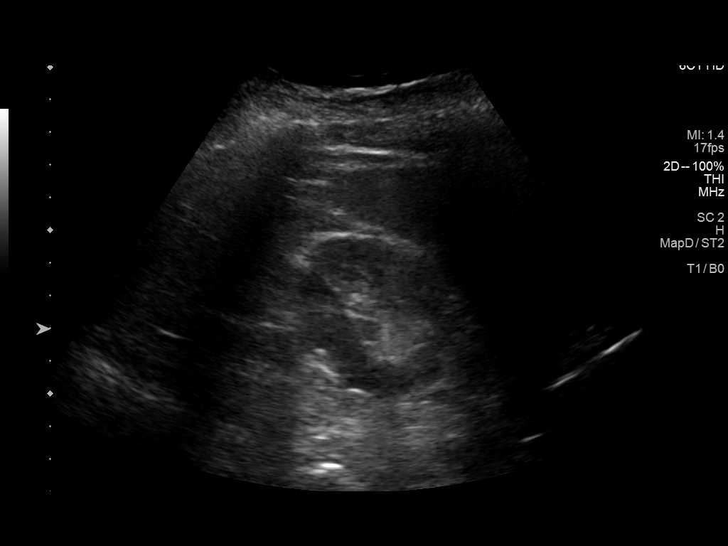
[im 12/35]
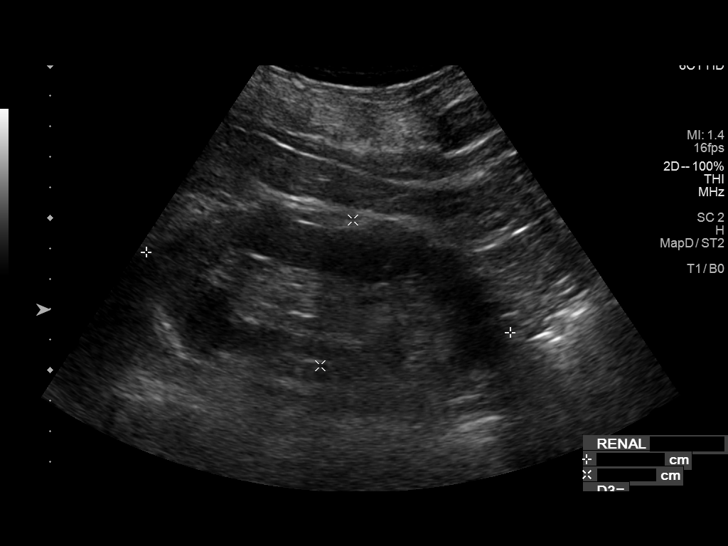
[im 13/35]
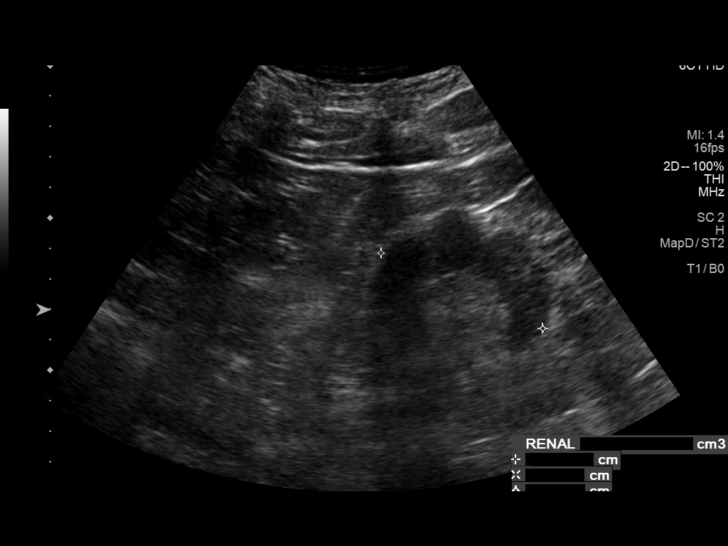
[im 16/35]
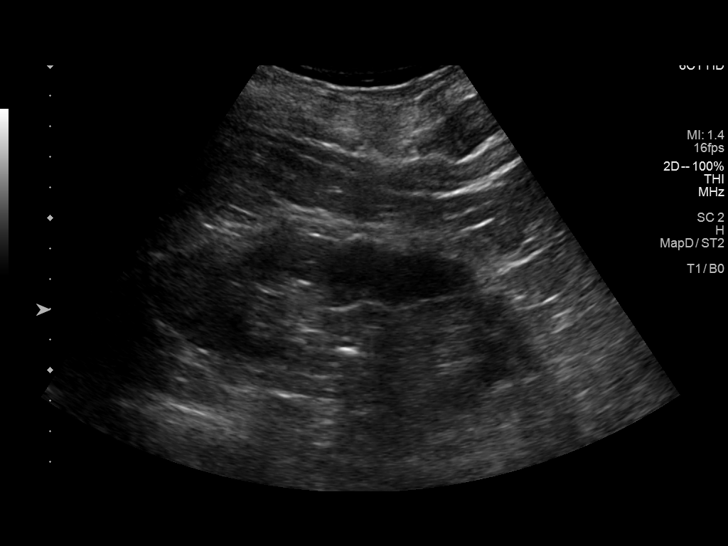
[im 19/35]
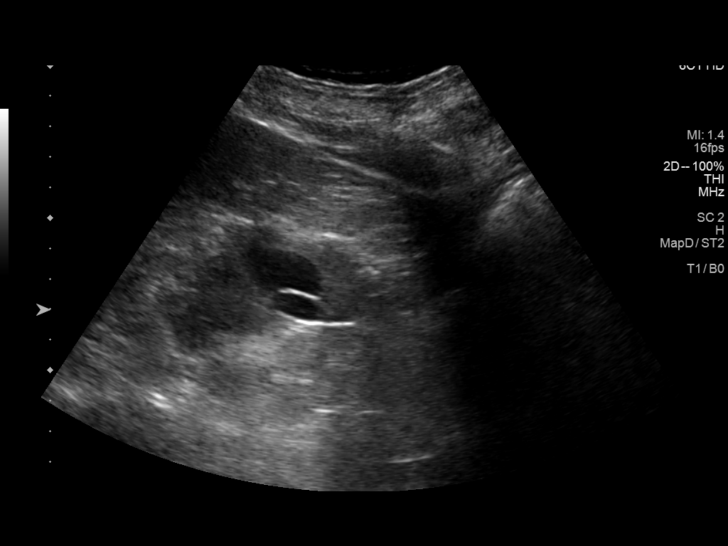
[im 22/35]
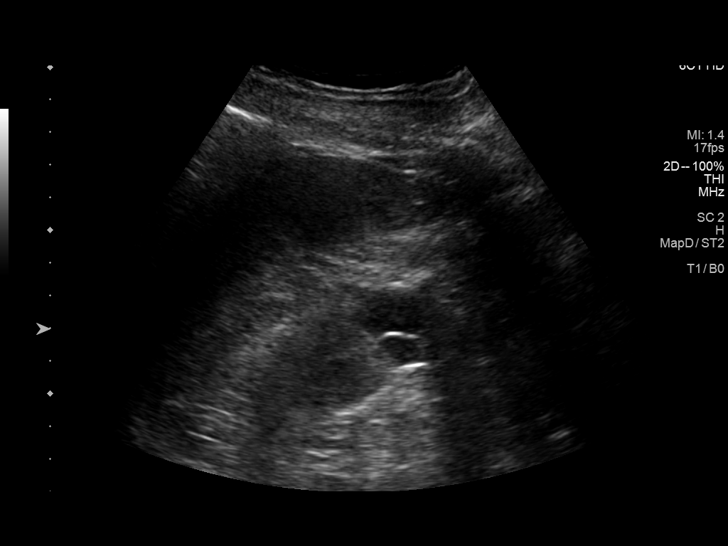
[im 23/35]
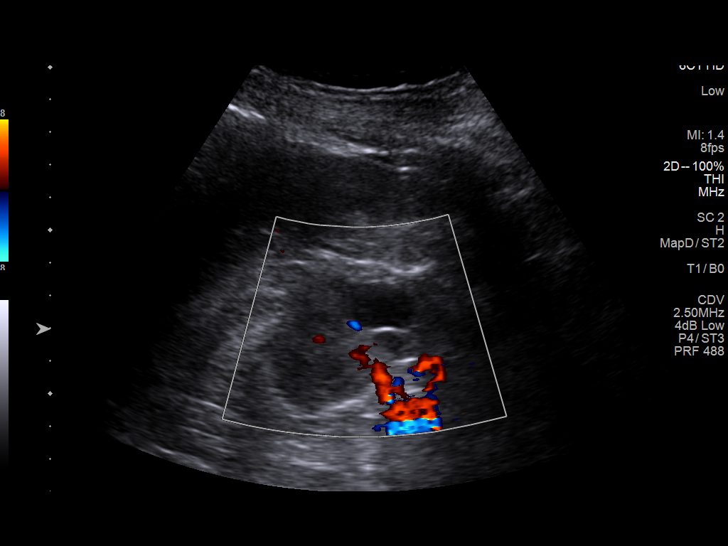
[im 26/35]
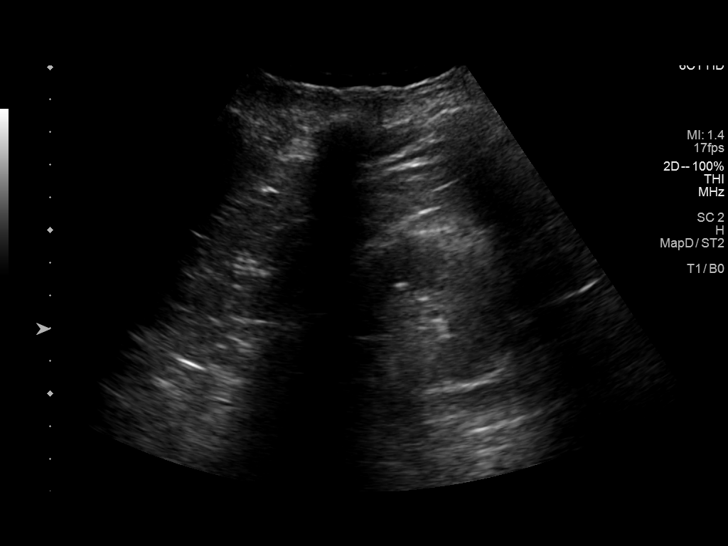
[im 29/35]
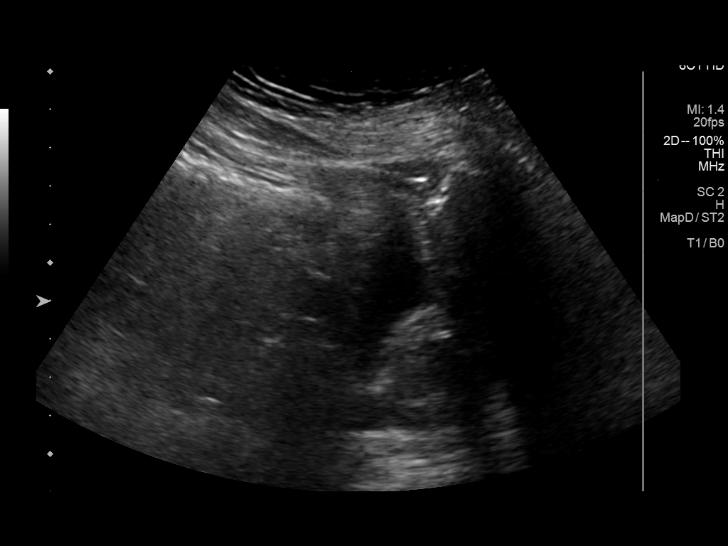
[im 32/35]
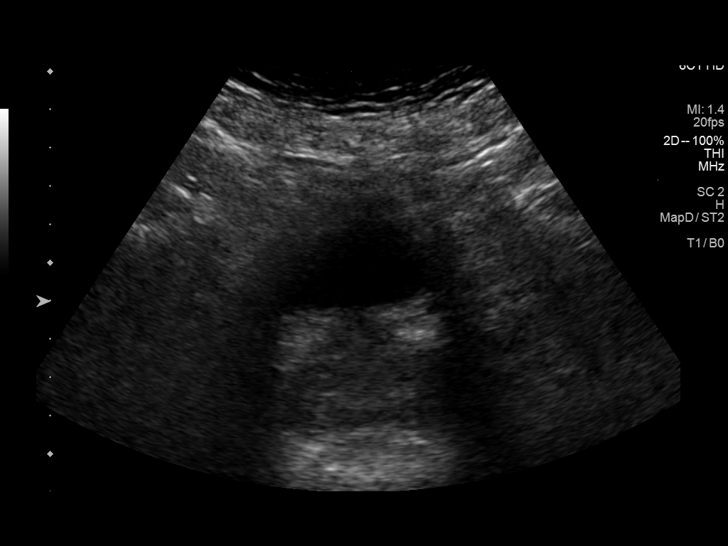
[im 35/35]
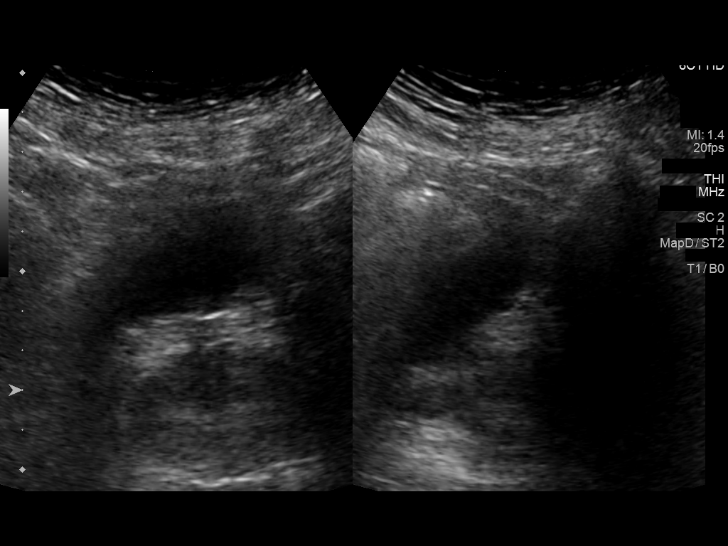

[14 of 25 positions shown; findings below may reference images not displayed]

FINDINGS: Right Kidney:

Renal measurements: 12.6 x 4.5 x 5.6 cm = volume: 167 mL.
Echogenicity within normal limits. No mass or hydronephrosis
visualized.

Left Kidney:

Renal measurements: 12.2 x 5.0 x 5.9 cm = volume: 184 mL.
Echogenicity within normal limits. No hydronephrosis visualized.
cm cystic mass over the mid to upper pole with subtle internal
echoes and single thin septation likely a complex cyst.

Bladder:

Appears normal for degree of bladder distention.

Other:

Mild prominence of the prostate gland with volume 32 cubic cm.
IMPRESSION: 1.  Normal size kidneys without hydronephrosis.

2. 2.6 cm mass over the mid to upper pole left kidney likely complex
cyst. Recommend renal protocol CT with and without contrast on an
elective basis for further evaluation.

## 2021-06-20 ENCOUNTER — Other Ambulatory Visit (HOSPITAL_BASED_OUTPATIENT_CLINIC_OR_DEPARTMENT_OTHER): Payer: Self-pay

## 2021-07-04 ENCOUNTER — Other Ambulatory Visit (HOSPITAL_BASED_OUTPATIENT_CLINIC_OR_DEPARTMENT_OTHER): Payer: Self-pay

## 2021-07-18 ENCOUNTER — Other Ambulatory Visit (HOSPITAL_BASED_OUTPATIENT_CLINIC_OR_DEPARTMENT_OTHER): Payer: Self-pay

## 2021-07-19 ENCOUNTER — Ambulatory Visit (INDEPENDENT_AMBULATORY_CARE_PROVIDER_SITE_OTHER): Payer: Medicare Other | Admitting: Family Medicine

## 2021-07-19 ENCOUNTER — Other Ambulatory Visit (HOSPITAL_BASED_OUTPATIENT_CLINIC_OR_DEPARTMENT_OTHER): Payer: Self-pay

## 2021-07-19 ENCOUNTER — Encounter (HOSPITAL_BASED_OUTPATIENT_CLINIC_OR_DEPARTMENT_OTHER): Payer: Self-pay | Admitting: Family Medicine

## 2021-07-19 DIAGNOSIS — J0110 Acute frontal sinusitis, unspecified: Secondary | ICD-10-CM

## 2021-07-19 DIAGNOSIS — J329 Chronic sinusitis, unspecified: Secondary | ICD-10-CM | POA: Insufficient documentation

## 2021-07-19 MED ORDER — ALBUTEROL SULFATE HFA 108 (90 BASE) MCG/ACT IN AERS
1.0000 | INHALATION_SPRAY | Freq: Four times a day (QID) | RESPIRATORY_TRACT | 0 refills | Status: DC | PRN
  Filled 2021-07-19: qty 8.5, 25d supply, fill #0

## 2021-07-19 MED ORDER — AMOXICILLIN-POT CLAVULANATE 875-125 MG PO TABS
1.0000 | ORAL_TABLET | Freq: Two times a day (BID) | ORAL | 0 refills | Status: AC
  Filled 2021-07-19: qty 14, 7d supply, fill #0

## 2021-07-19 NOTE — Progress Notes (Signed)
? ? ?  Procedures performed today:   ? ?None. ? ?Independent interpretation of notes and tests performed by another provider:  ? ?None. ? ?Brief History, Exam, Impression, and Recommendations:   ? ?BP 136/62   Pulse 62   Temp 97.8 ?F (36.6 ?C) (Oral)   Ht 5' 10.5" (1.791 m)   Wt 195 lb 9.6 oz (88.7 kg)   SpO2 98%   BMI 27.67 kg/m?  ? ?Sinusitis ?For about 10 days he has been experiencing some cough, sinus congestion, rhinorrhea. Did have some improvement in symptoms after a few days, but then had some worsening. No fevers, mild night sweats. Has been trying Mucinex. No chest pain. Also trying some OTC allergy medication - oral and intranasal. ?On exam, patient is in no acute distress. Cardiovascular exam with regular rate and rhythm. Lungs clear to auscultation bilaterally. ?Given history and exam, feel that it is possible that an initial viral sinus infection has transitioned to a bacterial sinus infection.  Due to this, would consider initiating antibiotics to help treat symptoms particularly given patient's medical history ?We will treat with Augmentin, prescription sent to pharmacy on file ?Patient can continue with OTC measures including Mucinex, intranasal sprays ?Encouraged adequate rest and hydration ?Advised that symptoms should begin to improve as the week goes on and into the weekend ?If symptoms not improving as expected, recommend contacting the office ? ?Plan for follow-up at previously scheduled appointment or sooner as needed ? ? ?___________________________________________ ?Shakeitha Umbaugh de Peru, MD, ABFM, CAQSM ?Primary Care and Sports Medicine ?[facility] ?

## 2021-07-19 NOTE — Assessment & Plan Note (Signed)
For about 10 days he has been experiencing some cough, sinus congestion, rhinorrhea. Did have some improvement in symptoms after a few days, but then had some worsening. No fevers, mild night sweats. Has been trying Mucinex. No chest pain. Also trying some OTC allergy medication - oral and intranasal. ?On exam, patient is in no acute distress. Cardiovascular exam with regular rate and rhythm. Lungs clear to auscultation bilaterally. ?Given history and exam, feel that it is possible that an initial viral sinus infection has transitioned to a bacterial sinus infection.  Due to this, would consider initiating antibiotics to help treat symptoms particularly given patient's medical history ?We will treat with Augmentin, prescription sent to pharmacy on file ?Patient can continue with OTC measures including Mucinex, intranasal sprays ?Encouraged adequate rest and hydration ?Advised that symptoms should begin to improve as the week goes on and into the weekend ?If symptoms not improving as expected, recommend contacting the office ?

## 2021-07-26 ENCOUNTER — Other Ambulatory Visit (HOSPITAL_BASED_OUTPATIENT_CLINIC_OR_DEPARTMENT_OTHER): Payer: Self-pay

## 2021-08-05 ENCOUNTER — Other Ambulatory Visit (HOSPITAL_BASED_OUTPATIENT_CLINIC_OR_DEPARTMENT_OTHER): Payer: Self-pay | Admitting: Family Medicine

## 2021-08-05 ENCOUNTER — Other Ambulatory Visit (HOSPITAL_BASED_OUTPATIENT_CLINIC_OR_DEPARTMENT_OTHER): Payer: Self-pay

## 2021-08-08 ENCOUNTER — Other Ambulatory Visit (HOSPITAL_BASED_OUTPATIENT_CLINIC_OR_DEPARTMENT_OTHER): Payer: Self-pay

## 2021-08-08 MED ORDER — FAMOTIDINE 20 MG PO TABS
40.0000 mg | ORAL_TABLET | Freq: Every day | ORAL | 0 refills | Status: DC
  Filled 2021-08-08: qty 180, 90d supply, fill #0

## 2021-08-12 ENCOUNTER — Ambulatory Visit (INDEPENDENT_AMBULATORY_CARE_PROVIDER_SITE_OTHER): Payer: Medicare Other

## 2021-08-12 ENCOUNTER — Encounter (HOSPITAL_BASED_OUTPATIENT_CLINIC_OR_DEPARTMENT_OTHER): Payer: Self-pay

## 2021-08-12 ENCOUNTER — Other Ambulatory Visit (HOSPITAL_BASED_OUTPATIENT_CLINIC_OR_DEPARTMENT_OTHER): Payer: Self-pay

## 2021-08-12 ENCOUNTER — Other Ambulatory Visit (HOSPITAL_BASED_OUTPATIENT_CLINIC_OR_DEPARTMENT_OTHER): Payer: Self-pay | Admitting: Family Medicine

## 2021-08-12 DIAGNOSIS — Z0000 Encounter for general adult medical examination without abnormal findings: Secondary | ICD-10-CM

## 2021-08-12 MED ORDER — RIVAROXABAN 15 MG PO TABS
15.0000 mg | ORAL_TABLET | Freq: Every day | ORAL | 1 refills | Status: DC
  Filled 2021-08-12: qty 90, 90d supply, fill #0
  Filled 2021-11-09: qty 90, 90d supply, fill #1

## 2021-08-12 MED ORDER — ISOSORBIDE MONONITRATE ER 60 MG PO TB24
60.0000 mg | ORAL_TABLET | Freq: Every day | ORAL | 1 refills | Status: DC
  Filled 2021-08-12: qty 90, 90d supply, fill #0
  Filled 2021-11-09: qty 90, 90d supply, fill #1

## 2021-08-12 NOTE — Progress Notes (Signed)
Subjective:   Frederick Tyler is a 71 y.o. male who presents for an Initial Medicare Annual Wellness Visit. I connected with  Frederick Tyler on 08/12/21 by a audio enabled telemedicine application and verified that I am speaking with the correct person using two identifiers.  Patient Location: Home  Provider Location: Home Office  I discussed the limitations of evaluation and management by telemedicine. The patient expressed understanding and agreed to proceed.     Objective:    Today's Vitals   There is no height or weight on file to calculate BMI.     12/31/2020   12:43 PM 07/13/2020    1:36 PM  Advanced Directives  Does Patient Have a Medical Advance Directive? Yes Yes  Type of Corporate treasurer of [place];Living will  Does patient want to make changes to medical advance directive?  No - Patient declined    Current Medications (verified) Outpatient Encounter Medications as of 08/12/2021  Medication Sig   albuterol (VENTOLIN HFA) 108 (90 Base) MCG/ACT inhaler Inhale 1-2 puffs into the lungs every 6 (six) hours as needed for wheezing or shortness of breath.   aspirin 81 MG chewable tablet Chew 1 tablet (81 mg total) by mouth daily.   atenolol (TENORMIN) 100 MG tablet Take 1 tablet (100 mg total) by mouth 2 (two) times daily.   famotidine (PEPCID) 20 MG tablet Take 2 tablets (40 mg total) by mouth at bedtime.   influenza vaccine adjuvanted (FLUAD) 0.5 ML injection Inject into the muscle.   metFORMIN (GLUCOPHAGE) 500 MG tablet Take 1 tablet (500 mg total) by mouth 2 (two) times daily with a meal.   NIFEdipine (PROCARDIA XL/NIFEDICAL XL) 60 MG 24 hr tablet Take 1 tablet (60 mg total) by mouth in the morning and at bedtime.   nitroGLYCERIN (NITROSTAT) 0.4 MG SL tablet Place 0.4 mg under the tongue every 5 (five) minutes as needed for chest pain (max dose of 3. At initiation of 3rd dose call 911.).   Rivaroxaban (XARELTO) 15 MG TABS tablet Take 1 tablet (15 mg total) by  mouth daily with supper.   rosuvastatin (CRESTOR) 20 MG tablet Take 1 tablet (20 mg total) by mouth daily.   tamsulosin (FLOMAX) 0.4 MG CAPS capsule Take 1 capsule (0.4 mg total) by mouth daily after supper.   [EXPIRED] amoxicillin-clavulanate (AUGMENTIN) 875-125 MG tablet Take 1 tablet by mouth 2 (two) times daily for 7 days.   dexamethasone (DECADRON) 4 MG tablet Take 1 tablet (4 mg total) by mouth 2 (two) times daily with a meal. (Patient not taking: Reported on 07/19/2021)   Fluticasone-Umeclidin-Vilant (TRELEGY ELLIPTA) 100-62.5-25 MCG/ACT AEPB Inhale 1 puff into the lungs daily. (Patient not taking: Reported on 07/19/2021)   Iron, Ferrous Sulfate, 325 (65 Fe) MG TABS Take 1 tablet by mouth on Monday, Wednesday, and Friday (Patient not taking: Reported on 07/19/2021)   isosorbide mononitrate (IMDUR) 60 MG 24 hr tablet Take 1 tablet (60 mg total) by mouth daily. (Patient not taking: Reported on 07/19/2021)   losartan (COZAAR) 50 MG tablet Take 1 tablet (50 mg total) by mouth in the morning and at bedtime. (Patient not taking: Reported on 07/19/2021)   Probiotic Product (PROBIOTIC BLEND PO) Take by mouth. NuZymes (Patient not taking: Reported on 08/12/2021)   [DISCONTINUED] famotidine (PEPCID) 20 MG tablet Take 2 tablets (40 mg total) by mouth at bedtime. (Patient not taking: Reported on 07/19/2021)   No facility-administered encounter medications on file as of 08/12/2021.    Allergies (  verified) Patient has no known allergies.   History: Past Medical History:  Diagnosis Date   Chronic kidney disease    GERD (gastroesophageal reflux disease)    Hypertension    History reviewed. No pertinent surgical history. Family History  Family history unknown: Yes   Social History   Socioeconomic History   Marital status: Married    Spouse name: Not on file   Number of children: Not on file   Years of education: Not on file   Highest education level: Not on file  Occupational History   Not on  file  Tobacco Use   Smoking status: Former    Types: Cigarettes   Smokeless tobacco: Never  Vaping Use   Vaping Use: Never used  Substance and Sexual Activity   Alcohol use: Never   Drug use: Never   Sexual activity: Yes    Birth control/protection: None  Other Topics Concern   Not on file  Social History Narrative   Not on file   Social Determinants of Health   Financial Resource Strain: Low Risk    Difficulty of Paying Living Expenses: Not hard at all  Food Insecurity: No Food Insecurity   Worried About Charity fundraiser in the Last Year: Never true   [place] in the Last Year: Never true  Transportation Needs: No Transportation Needs   Lack of Transportation (Medical): No   Lack of Transportation (Non-Medical): No  Physical Activity: Sufficiently Active   Days of Exercise per Week: 6 days   Minutes of Exercise per Session: 60 min  Stress: No Stress Concern Present   Feeling of Stress : Not at all  Social Connections: Moderately Isolated   Frequency of Communication with Friends and Family: More than three times a week   Frequency of Social Gatherings with Friends and Family: Twice a week   Attends Religious Services: Never   Marine scientist or Organizations: No   Attends Music therapist: Never   Marital Status: Married    Tobacco Counseling Counseling given: Not Answered   Clinical Intake:  Pre-visit preparation completed: Yes  Pain : No/denies pain     Diabetes: Yes CBG done?: No Did pt. bring in CBG monitor from home?: No  How often do you need to have someone help you when you read instructions, pamphlets, or other written materials from your doctor or pharmacy?: 1 - Never What is the last grade level you completed in school?: degree  Diabetic?yes  Interpreter Needed?: No      Activities of Daily Living    08/12/2021   11:48 AM 08/11/2021    5:43 PM  In your present state of health, do you have any difficulty  performing the following activities:  Hearing? 0 0  Vision? 0 0  Difficulty concentrating or making decisions? 0 0  Walking or climbing stairs? 0 0  Dressing or bathing? 0 0  Doing errands, shopping? 0 0  Preparing Food and eating ?  N  Using the Toilet?  N  In the past six months, have you accidently leaked urine?  N  Do you have problems with loss of bowel control?  N  Managing your Medications?  N  Managing your Finances?  N  Housekeeping or managing your Housekeeping?  N    Patient Care Team: de [place], Blondell Reveal, MD as PCP - General (Family Medicine)  Indicate any recent Medical Services you may have received from other than [facility] providers  in the past year (date may be approximate).     Assessment:   This is a routine wellness examination for [employer].  Hearing/Vision screen No results found.  Dietary issues and exercise activities discussed:     Goals Addressed   None   Depression Screen    08/12/2021   11:43 AM 07/19/2021    1:55 PM 08/30/2020    2:20 PM  PHQ 2/9 Scores  PHQ - 2 Score 0 0 0  Exception Documentation  Medical reason     Fall Risk    08/12/2021   11:48 AM 08/11/2021    5:43 PM 07/19/2021    1:54 PM  Fall Risk   Falls in the past year? 0 0 0  Number falls in past yr: 0  0  Injury with Fall? 0  0  Risk for fall due to : No Fall Risks  No Fall Risks  Follow up Falls evaluation completed  Falls evaluation completed    FALL RISK PREVENTION PERTAINING TO THE HOME:  Any stairs in or around the home? No  If so, are there any without handrails? No  Home free of loose throw rugs in walkways, pet beds, electrical cords, etc? Yes  Adequate lighting in your home to reduce risk of falls? Yes   ASSISTIVE DEVICES UTILIZED TO PREVENT FALLS:  Life alert? No  Use of a cane, walker or w/c? No  Grab bars in the bathroom? No  Shower chair or bench in shower? No  Elevated toilet seat or a handicapped toilet? No    Cognitive Function:        08/12/2021    11:49 AM  6CIT Screen  What Year? 0 points  What month? 0 points  What time? 0 points  Count back from 20 0 points  Months in reverse 0 points  Repeat phrase 2 points  Total Score 2 points    Immunizations  There is no immunization history on file for this patient.  TDAP status: Due, Education has been provided regarding the importance of this vaccine. Advised may receive this vaccine at local pharmacy or Health Dept. Aware to provide a copy of the vaccination record if obtained from local pharmacy or Health Dept. Verbalized acceptance and understanding.  Flu Vaccine status: Up to date  Pneumococcal vaccine status: Due, Education has been provided regarding the importance of this vaccine. Advised may receive this vaccine at local pharmacy or Health Dept. Aware to provide a copy of the vaccination record if obtained from local pharmacy or Health Dept. Verbalized acceptance and understanding.  Covid-19 vaccine status: Declined, Education has been provided regarding the importance of this vaccine but patient still declined. Advised may receive this vaccine at local pharmacy or Health Dept.or vaccine clinic. Aware to provide a copy of the vaccination record if obtained from local pharmacy or Health Dept. Verbalized acceptance and understanding.  Qualifies for Shingles Vaccine? Yes   Zostavax completed No   Shingrix Completed?: No.    Education has been provided regarding the importance of this vaccine. Patient has been advised to call insurance company to determine out of pocket expense if they have not yet received this vaccine. Advised may also receive vaccine at local pharmacy or Health Dept. Verbalized acceptance and understanding.  Screening Tests Health Maintenance  Topic Date Due   COVID-19 Vaccine (1) Never done   FOOT EXAM  Never done   OPHTHALMOLOGY EXAM  Never done   Hepatitis C Screening  Never done   TETANUS/TDAP  Never done   COLONOSCOPY (Pts 45-63yrs Insurance coverage  will need to be confirmed)  Never done   Zoster Vaccines- Shingrix (1 of 2) Never done   Pneumonia Vaccine 15+ Years old (1 - PCV) Never done   HEMOGLOBIN A1C  12/28/2020   INFLUENZA VACCINE  10/25/2021   HPV VACCINES  Aged Out    Health Maintenance  Health Maintenance Due  Topic Date Due   COVID-19 Vaccine (1) Never done   FOOT EXAM  Never done   OPHTHALMOLOGY EXAM  Never done   Hepatitis C Screening  Never done   TETANUS/TDAP  Never done   COLONOSCOPY (Pts 45-88yrs Insurance coverage will need to be confirmed)  Never done   Zoster Vaccines- Shingrix (1 of 2) Never done   Pneumonia Vaccine 58+ Years old (1 - PCV) Never done   HEMOGLOBIN A1C  12/28/2020    Colorectal cancer screening: Referral to GI placed provider notifed. Pt aware the office will call re: appt.  Lung Cancer Screening: (Low Dose CT Chest recommended if Age 20-80 years, 30 pack-year currently smoking OR have quit w/in 15years.) does qualify.   Lung Cancer Screening Referral: provider notified  Additional Screening:  Hepatitis C Screening: does qualify; Completed not completed, provider notified   Vision Screening: Recommended annual ophthalmology exams for early detection of glaucoma and other disorders of the eye. Is the patient up to date with their annual eye exam?  No  Who is the provider or what is the name of the office in which the patient attends annual eye exams? Patient does not remember  If pt is not established with a provider, would they like to be referred to a provider to establish care? No .   Dental Screening: Recommended annual dental exams for proper oral hygiene  Community Resource Referral / Chronic Care Management: CRR required this visit?  No   CCM required this visit?  No      Plan:     I have personally reviewed and noted the following in the patient's chart:   Medical and social history Use of alcohol, tobacco or illicit drugs  Current medications and supplements  including opioid prescriptions. Patient is not currently taking opioid prescriptions. Functional ability and status Nutritional status Physical activity Advanced directives List of other physicians Hospitalizations, surgeries, and ER visits in previous 12 months Vitals Screenings to include cognitive, depression, and falls Referrals and appointments  In addition, I have reviewed and discussed with patient certain preventive protocols, quality metrics, and best practice recommendations. A written personalized care plan for preventive services as well as general preventive health recommendations were provided to patient.     Debbora Dus, [place]   08/12/2021   Nurse Notes: pt reports having a numbness on left side, he will reach out to office to make appointment. He also reports having colonoscopy 3 years ago, in [place]

## 2021-08-12 NOTE — Patient Instructions (Signed)
Health Maintenance, Male Adopting a healthy lifestyle and getting preventive care are important in promoting health and wellness. Ask your health care provider about: The right schedule for you to have regular tests and exams. Things you can do on your own to prevent diseases and keep yourself healthy. What should I know about diet, weight, and exercise? Eat a healthy diet  Eat a diet that includes plenty of vegetables, fruits, low-fat dairy products, and lean protein. Do not eat a lot of foods that are high in solid fats, added sugars, or sodium. Maintain a healthy weight Body mass index (BMI) is a measurement that can be used to identify possible weight problems. It estimates body fat based on height and weight. Your health care provider can help determine your BMI and help you achieve or maintain a healthy weight. Get regular exercise Get regular exercise. This is one of the most important things you can do for your health. Most adults should: Exercise for at least 150 minutes each week. The exercise should increase your heart rate and make you sweat (moderate-intensity exercise). Do strengthening exercises at least twice a week. This is in addition to the moderate-intensity exercise. Spend less time sitting. Even light physical activity can be beneficial. Watch cholesterol and blood lipids Have your blood tested for lipids and cholesterol at 71 years of age, then have this test every 5 years. You may need to have your cholesterol levels checked more often if: Your lipid or cholesterol levels are high. You are older than 71 years of age. You are at high risk for heart disease. What should I know about cancer screening? Many types of cancers can be detected early and may often be prevented. Depending on your health history and family history, you may need to have cancer screening at various ages. This may include screening for: Colorectal cancer. Prostate cancer. Skin cancer. Lung  cancer. What should I know about heart disease, diabetes, and high blood pressure? Blood pressure and heart disease High blood pressure causes heart disease and increases the risk of stroke. This is more likely to develop in people who have high blood pressure readings or are overweight. Talk with your health care provider about your target blood pressure readings. Have your blood pressure checked: Every 3-5 years if you are 18-39 years of age. Every year if you are 40 years old or older. If you are between the ages of 65 and 75 and are a current or former smoker, ask your health care provider if you should have a one-time screening for abdominal aortic aneurysm (AAA). Diabetes Have regular diabetes screenings. This checks your fasting blood sugar level. Have the screening done: Once every three years after age 45 if you are at a normal weight and have a low risk for diabetes. More often and at a younger age if you are overweight or have a high risk for diabetes. What should I know about preventing infection? Hepatitis B If you have a higher risk for hepatitis B, you should be screened for this virus. Talk with your health care provider to find out if you are at risk for hepatitis B infection. Hepatitis C Blood testing is recommended for: Everyone born from 1945 through 1965. Anyone with known risk factors for hepatitis C. Sexually transmitted infections (STIs) You should be screened each year for STIs, including gonorrhea and chlamydia, if: You are sexually active and are younger than 71 years of age. You are older than 71 years of age and your   health care provider tells you that you are at risk for this type of infection. Your sexual activity has changed since you were last screened, and you are at increased risk for chlamydia or gonorrhea. Ask your health care provider if you are at risk. Ask your health care provider about whether you are at high risk for HIV. Your health care provider  may recommend a prescription medicine to help prevent HIV infection. If you choose to take medicine to prevent HIV, you should first get tested for HIV. You should then be tested every 3 months for as long as you are taking the medicine. Follow these instructions at home: Alcohol use Do not drink alcohol if your health care provider tells you not to drink. If you drink alcohol: Limit how much you have to 0-2 drinks a day. Know how much alcohol is in your drink. In the [place] one drink equals one 12 oz bottle of beer (355 mL), one 5 oz glass of wine (148 mL), or one 1 oz glass of hard liquor (44 mL). Lifestyle Do not use any products that contain nicotine or tobacco. These products include cigarettes, chewing tobacco, and vaping devices, such as e-cigarettes. If you need help quitting, ask your health care provider. Do not use street drugs. Do not share needles. Ask your health care provider for help if you need support or information about quitting drugs. General instructions Schedule regular health, dental, and eye exams. Stay current with your vaccines. Tell your health care provider if: You often feel depressed. You have ever been abused or do not feel safe at home. Summary Adopting a healthy lifestyle and getting preventive care are important in promoting health and wellness. Follow your health care provider's instructions about healthy diet, exercising, and getting tested or screened for diseases. Follow your health care provider's instructions on monitoring your cholesterol and blood pressure. This information is not intended to replace advice given to you by your health care provider. Make sure you discuss any questions you have with your health care provider. Document Revised: 08/02/2020 Document Reviewed: 08/02/2020 Elsevier Patient Education  2023 Elsevier Inc.  

## 2021-08-15 ENCOUNTER — Encounter (HOSPITAL_BASED_OUTPATIENT_CLINIC_OR_DEPARTMENT_OTHER): Payer: Medicare Other | Admitting: Family Medicine

## 2021-08-18 ENCOUNTER — Other Ambulatory Visit (HOSPITAL_BASED_OUTPATIENT_CLINIC_OR_DEPARTMENT_OTHER): Payer: Self-pay | Admitting: Family Medicine

## 2021-08-18 ENCOUNTER — Other Ambulatory Visit (HOSPITAL_BASED_OUTPATIENT_CLINIC_OR_DEPARTMENT_OTHER): Payer: Self-pay

## 2021-08-18 MED ORDER — NIFEDIPINE ER OSMOTIC RELEASE 60 MG PO TB24
60.0000 mg | ORAL_TABLET | Freq: Two times a day (BID) | ORAL | 0 refills | Status: DC
  Filled 2021-08-18: qty 180, 90d supply, fill #0

## 2021-08-18 MED ORDER — ROSUVASTATIN CALCIUM 20 MG PO TABS
20.0000 mg | ORAL_TABLET | Freq: Every day | ORAL | 1 refills | Status: DC
  Filled 2021-08-18: qty 90, 90d supply, fill #0
  Filled 2021-11-21: qty 90, 90d supply, fill #1

## 2021-08-31 ENCOUNTER — Other Ambulatory Visit (HOSPITAL_BASED_OUTPATIENT_CLINIC_OR_DEPARTMENT_OTHER): Payer: Self-pay

## 2021-09-06 ENCOUNTER — Other Ambulatory Visit (HOSPITAL_COMMUNITY): Payer: Self-pay

## 2021-09-07 ENCOUNTER — Other Ambulatory Visit (HOSPITAL_BASED_OUTPATIENT_CLINIC_OR_DEPARTMENT_OTHER): Payer: Self-pay

## 2021-09-26 ENCOUNTER — Other Ambulatory Visit (HOSPITAL_BASED_OUTPATIENT_CLINIC_OR_DEPARTMENT_OTHER): Payer: Self-pay

## 2021-09-26 ENCOUNTER — Other Ambulatory Visit (HOSPITAL_BASED_OUTPATIENT_CLINIC_OR_DEPARTMENT_OTHER): Payer: Self-pay | Admitting: Family Medicine

## 2021-09-26 MED ORDER — ATENOLOL 100 MG PO TABS
100.0000 mg | ORAL_TABLET | Freq: Two times a day (BID) | ORAL | 1 refills | Status: DC
  Filled 2021-09-26: qty 180, 90d supply, fill #0
  Filled 2021-12-27: qty 50, 25d supply, fill #1
  Filled 2021-12-27: qty 130, 65d supply, fill #1

## 2021-10-25 ENCOUNTER — Ambulatory Visit (INDEPENDENT_AMBULATORY_CARE_PROVIDER_SITE_OTHER): Payer: Medicare Other | Admitting: Family Medicine

## 2021-10-25 ENCOUNTER — Encounter (HOSPITAL_BASED_OUTPATIENT_CLINIC_OR_DEPARTMENT_OTHER): Payer: Self-pay | Admitting: Family Medicine

## 2021-10-25 DIAGNOSIS — E1122 Type 2 diabetes mellitus with diabetic chronic kidney disease: Secondary | ICD-10-CM

## 2021-10-25 DIAGNOSIS — N1831 Chronic kidney disease, stage 3a: Secondary | ICD-10-CM

## 2021-10-25 DIAGNOSIS — J329 Chronic sinusitis, unspecified: Secondary | ICD-10-CM | POA: Diagnosis not present

## 2021-10-25 DIAGNOSIS — I10 Essential (primary) hypertension: Secondary | ICD-10-CM

## 2021-10-25 NOTE — Assessment & Plan Note (Signed)
Review blood pressure at next appointment, ensure no medication adjustments are needed regarding this We will check labs prior to next appointment as well.  Review labs at next office visit.

## 2021-10-25 NOTE — Patient Instructions (Signed)
Lab Work: Your physician has recommended that you have lab work a few days before your next appointment. If you have labs (blood work) drawn today and your tests are completely normal, you will receive your results via MyChart message OR a phone call from our staff.  Please ensure you check your voicemail in the event that you authorized detailed messages to be left on a delegated number. If you have any lab test that is abnormal or we need to change your treatment, we will call you to review the results.   Follow-Up: Your next appointment:   Your physician recommends that you schedule a follow-up appointment in: 1-2 MONTHS with Dr. de [place]   You will receive a text message or e-mail with a link to a survey about your care and experience with [place] today! We would greatly appreciate your feedback!    Thanks for letting us be apart of your health journey!!  Primary Care and Sports Medicine     Dr. Ceasar Mons [place]    We encourage you to activate your patient portal called "MyChart".  Sign up information is provided on this After Visit Summary.  MyChart is used to connect with patients for Virtual Visits (Telemedicine).  Patients are able to view lab/test results, encounter notes, upcoming appointments, etc.  Non-urgent messages can be sent to your provider as well. To learn more about what you can do with MyChart, please visit --  [email or web address].

## 2021-10-25 NOTE — Progress Notes (Signed)
    Procedures performed today:    None.  Independent interpretation of notes and tests performed by another provider:   None.  Brief History, Exam, Impression, and Recommendations:    BP (!) 139/58   Pulse (!) 56   Ht 5\' 10"  (1.778 m)   Wt 199 lb (90.3 kg)   BMI 28.55 kg/m   Chronic sinusitis Patient continues to have sinus congestion, pressure which has been ongoing for a few months now.  Has been utilizing nasal sprays, OTC medications without significant relief.  Pressure primarily is located over frontal sinuses. Does have some tenderness to palpation over frontal sinuses today. Given continued symptoms despite current measures, will refer to ENT for further evaluation and recommendations. At this time, can continue with nasal saline spray, intranasal steroid spray, OTC antihistamine.  Hypertension Review blood pressure at next appointment, ensure no medication adjustments are needed regarding this We will check labs prior to next appointment as well.  Review labs at next office visit.  Diabetes mellitus with chronic kidney disease [facility]) Patient is overdue for follow-up regarding diabetes.  At this time, he is due for hemoglobin A1c as well as urine microalbumin/creatinine ratio.  Orders placed for these to be completed prior to follow-up appointment in about 1 to 2 months At next appointment, will also complete foot exam, discussed pneumococcal vaccination  Return in about 6 weeks (around 12/06/2021) for HTN, DM.   ___________________________________________ Frederick Tyler de 02/05/2022, MD, ABFM, [facility] Primary Care and Sports Medicine [facility]

## 2021-10-25 NOTE — Assessment & Plan Note (Signed)
Patient is overdue for follow-up regarding diabetes.  At this time, he is due for hemoglobin A1c as well as urine microalbumin/creatinine ratio.  Orders placed for these to be completed prior to follow-up appointment in about 1 to 2 months At next appointment, will also complete foot exam, discussed pneumococcal vaccination

## 2021-10-25 NOTE — Assessment & Plan Note (Signed)
Patient continues to have sinus congestion, pressure which has been ongoing for a few months now.  Has been utilizing nasal sprays, OTC medications without significant relief.  Pressure primarily is located over frontal sinuses. Does have some tenderness to palpation over frontal sinuses today. Given continued symptoms despite current measures, will refer to ENT for further evaluation and recommendations. At this time, can continue with nasal saline spray, intranasal steroid spray, OTC antihistamine.

## 2021-11-01 ENCOUNTER — Telehealth (HOSPITAL_BASED_OUTPATIENT_CLINIC_OR_DEPARTMENT_OTHER): Payer: Self-pay

## 2021-11-01 NOTE — Telephone Encounter (Signed)
DOS of 5/19 was denied coverage by [employer]. After review of claim, the incorrect modifier was used for this visit. Request for resubmission has been placed.

## 2021-11-07 IMAGING — DX DG CHEST 2V
2 series · 2 of 2 positions shown · non-contrast
Comparison: None.

CLINICAL DATA: Cough for 3 weeks, smoker

EXAM:
CHEST - 2 VIEW

[chest pa]
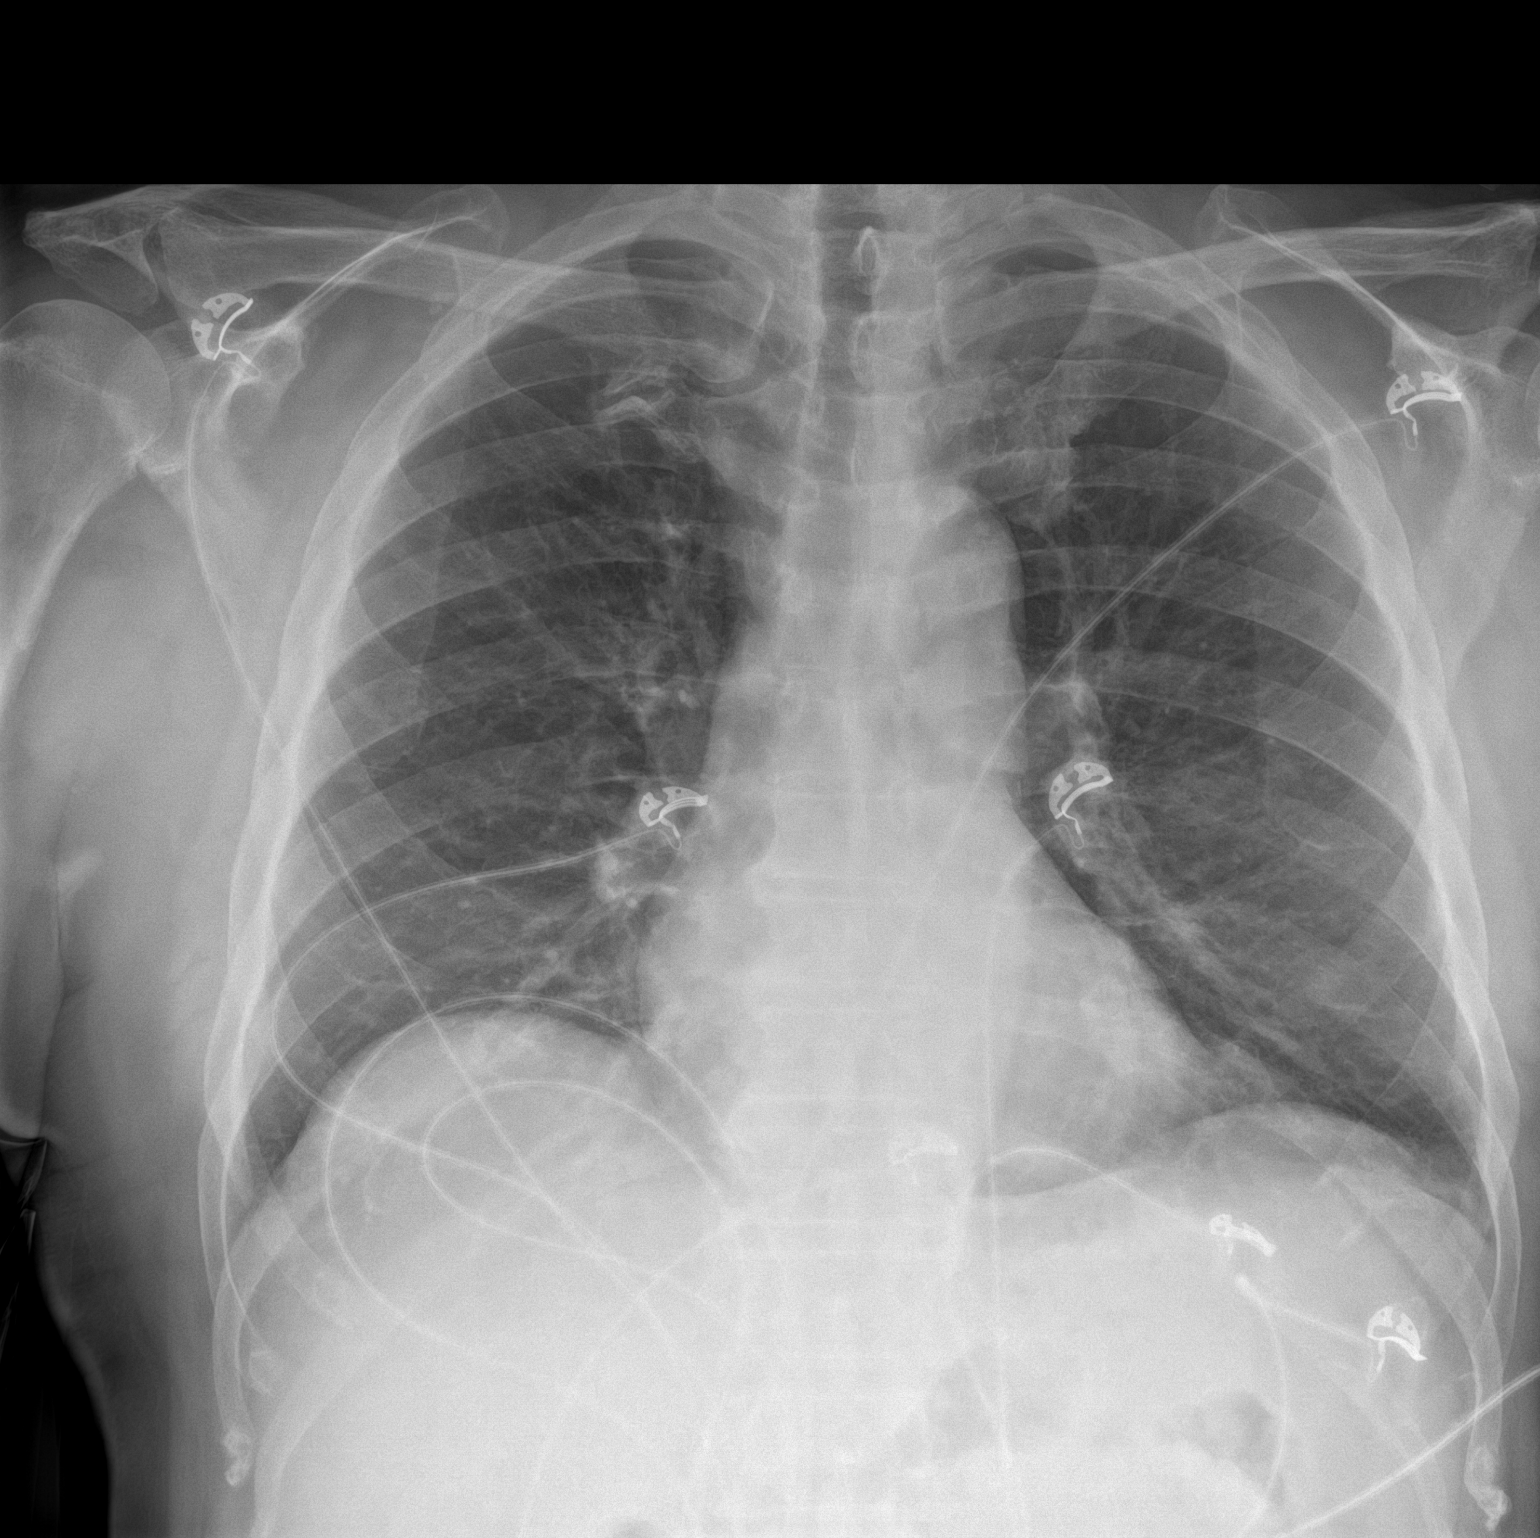

[chest lat]
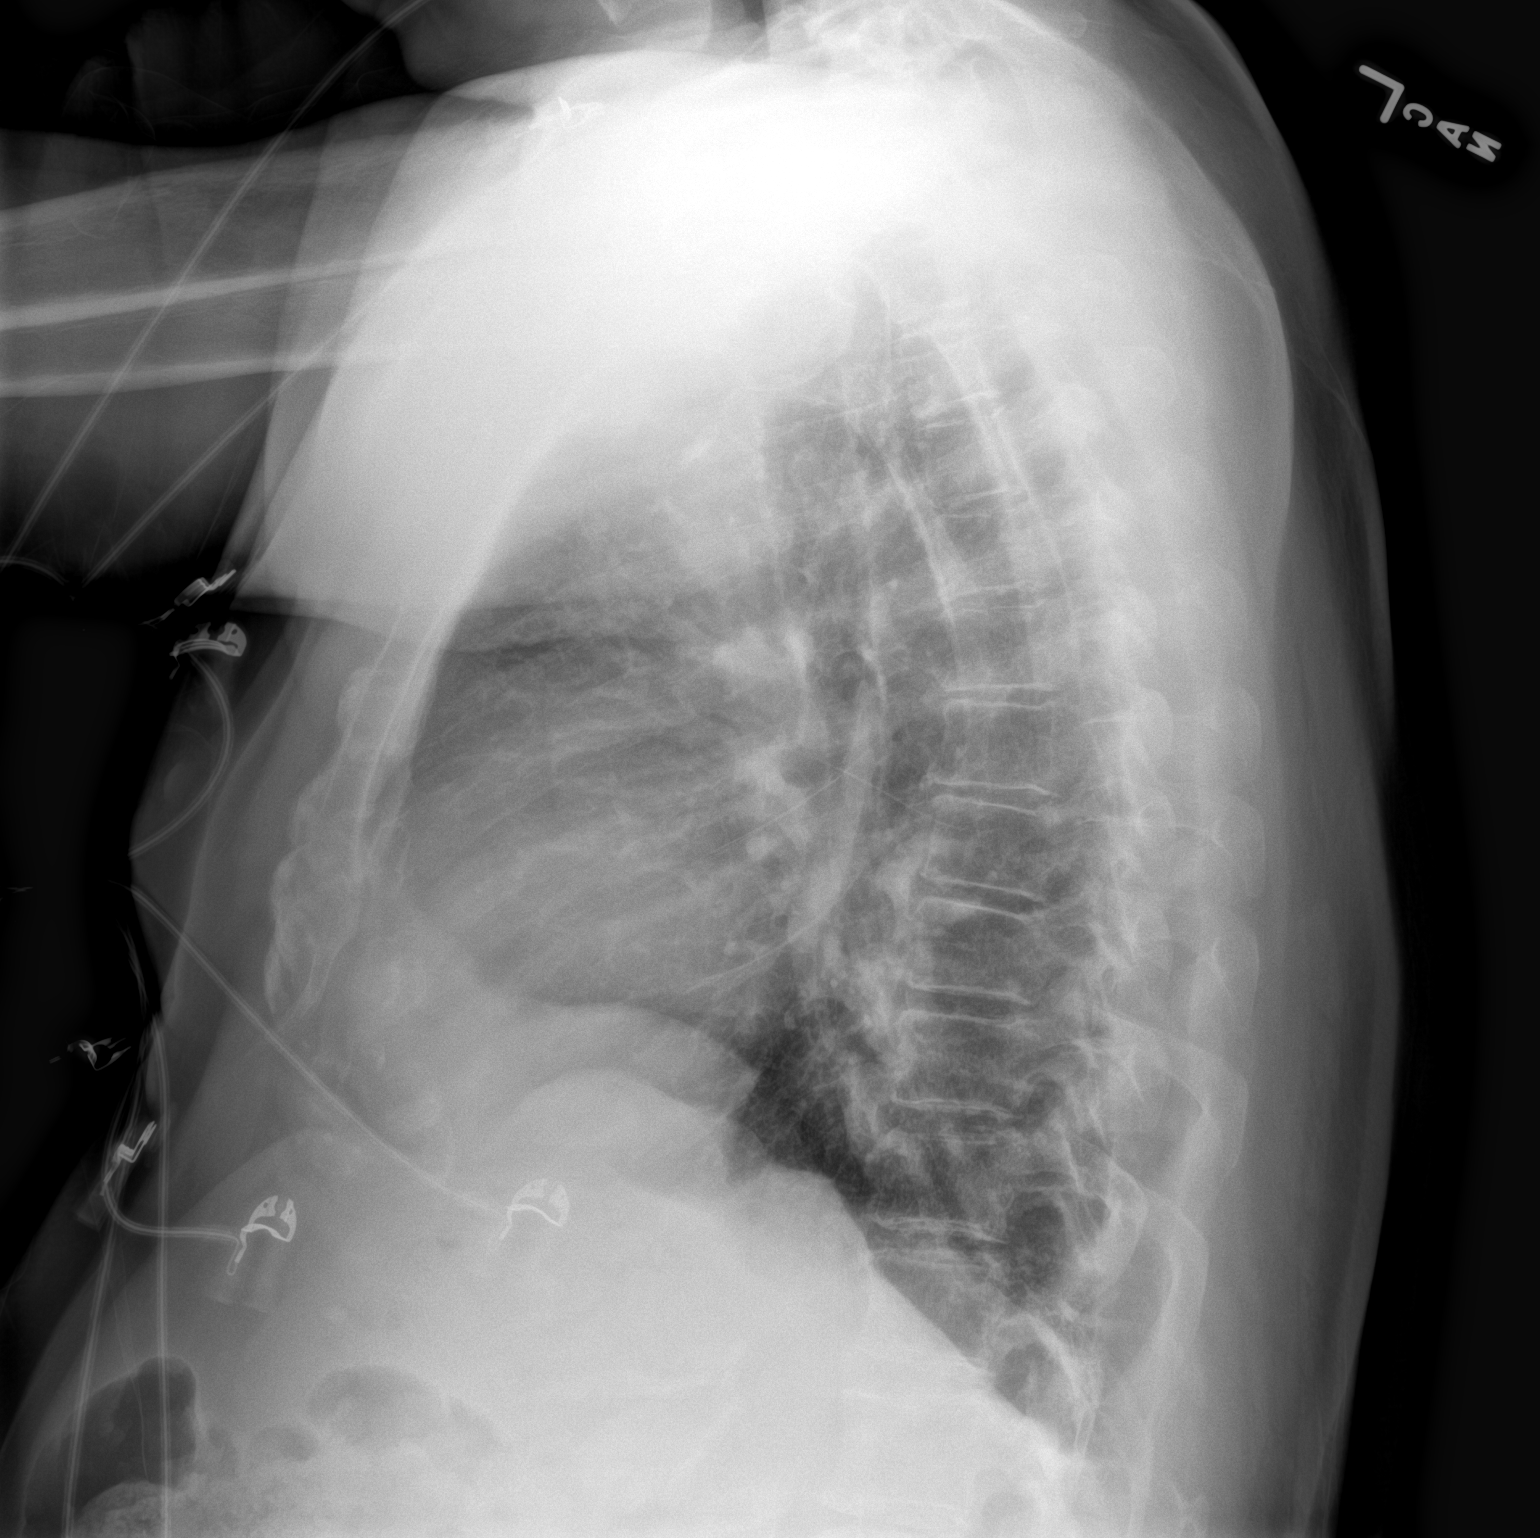

[2 of 2 positions shown; findings below may reference images not displayed]

FINDINGS: Normal heart size. Normal mediastinal contour. No pneumothorax. No
pleural effusion. Lungs appear clear, with no acute consolidative
airspace disease and no pulmonary edema.
IMPRESSION: No active cardiopulmonary disease.

## 2021-11-09 ENCOUNTER — Other Ambulatory Visit (HOSPITAL_BASED_OUTPATIENT_CLINIC_OR_DEPARTMENT_OTHER): Payer: Self-pay | Admitting: Family Medicine

## 2021-11-09 ENCOUNTER — Other Ambulatory Visit (HOSPITAL_BASED_OUTPATIENT_CLINIC_OR_DEPARTMENT_OTHER): Payer: Self-pay

## 2021-11-09 MED ORDER — FAMOTIDINE 20 MG PO TABS
40.0000 mg | ORAL_TABLET | Freq: Every day | ORAL | 0 refills | Status: DC
  Filled 2021-11-09: qty 180, 90d supply, fill #0

## 2021-11-21 ENCOUNTER — Other Ambulatory Visit (HOSPITAL_BASED_OUTPATIENT_CLINIC_OR_DEPARTMENT_OTHER): Payer: Self-pay | Admitting: Family Medicine

## 2021-11-21 ENCOUNTER — Other Ambulatory Visit (HOSPITAL_BASED_OUTPATIENT_CLINIC_OR_DEPARTMENT_OTHER): Payer: Self-pay

## 2021-11-21 MED ORDER — NIFEDIPINE ER OSMOTIC RELEASE 60 MG PO TB24
60.0000 mg | ORAL_TABLET | Freq: Two times a day (BID) | ORAL | 0 refills | Status: DC
  Filled 2021-11-21: qty 180, 90d supply, fill #0

## 2021-11-22 ENCOUNTER — Other Ambulatory Visit (HOSPITAL_BASED_OUTPATIENT_CLINIC_OR_DEPARTMENT_OTHER): Payer: Self-pay

## 2021-11-30 ENCOUNTER — Other Ambulatory Visit (HOSPITAL_BASED_OUTPATIENT_CLINIC_OR_DEPARTMENT_OTHER): Payer: Self-pay

## 2021-11-30 ENCOUNTER — Other Ambulatory Visit (HOSPITAL_BASED_OUTPATIENT_CLINIC_OR_DEPARTMENT_OTHER): Payer: Self-pay | Admitting: Family Medicine

## 2021-11-30 DIAGNOSIS — I10 Essential (primary) hypertension: Secondary | ICD-10-CM

## 2021-11-30 MED ORDER — LOSARTAN POTASSIUM 50 MG PO TABS
50.0000 mg | ORAL_TABLET | Freq: Two times a day (BID) | ORAL | 1 refills | Status: DC
  Filled 2021-11-30: qty 180, 90d supply, fill #0
  Filled 2022-02-23: qty 180, 90d supply, fill #1

## 2021-12-02 ENCOUNTER — Other Ambulatory Visit (HOSPITAL_BASED_OUTPATIENT_CLINIC_OR_DEPARTMENT_OTHER): Payer: Self-pay | Admitting: Family Medicine

## 2021-12-02 ENCOUNTER — Other Ambulatory Visit (HOSPITAL_BASED_OUTPATIENT_CLINIC_OR_DEPARTMENT_OTHER): Payer: Self-pay

## 2021-12-05 ENCOUNTER — Other Ambulatory Visit (HOSPITAL_BASED_OUTPATIENT_CLINIC_OR_DEPARTMENT_OTHER): Payer: Self-pay

## 2021-12-05 MED ORDER — METFORMIN HCL 500 MG PO TABS
500.0000 mg | ORAL_TABLET | Freq: Two times a day (BID) | ORAL | 1 refills | Status: DC
  Filled 2021-12-05: qty 180, 90d supply, fill #0
  Filled 2022-03-07: qty 180, 90d supply, fill #1

## 2021-12-19 ENCOUNTER — Ambulatory Visit (HOSPITAL_BASED_OUTPATIENT_CLINIC_OR_DEPARTMENT_OTHER): Payer: Medicare Other

## 2021-12-19 DIAGNOSIS — E1122 Type 2 diabetes mellitus with diabetic chronic kidney disease: Secondary | ICD-10-CM | POA: Diagnosis not present

## 2021-12-19 DIAGNOSIS — N1831 Chronic kidney disease, stage 3a: Secondary | ICD-10-CM | POA: Diagnosis not present

## 2021-12-19 DIAGNOSIS — I10 Essential (primary) hypertension: Secondary | ICD-10-CM | POA: Diagnosis not present

## 2021-12-20 LAB — LIPID PANEL
Chol/HDL Ratio: 2.7 ratio (ref 0.0–5.0)
Cholesterol, Total: 112 mg/dL (ref 100–199)
HDL: 42 mg/dL (ref >39)
LDL Chol Calc (NIH): 34 mg/dL (ref 0–99)
Triglycerides: 236 mg/dL — ABNORMAL HIGH (ref 0–149)
VLDL Cholesterol Cal: 36 mg/dL (ref 5–40)

## 2021-12-20 LAB — COMPREHENSIVE METABOLIC PANEL
ALT: 20 IU/L (ref 0–44)
AST: 17 IU/L (ref 0–40)
Albumin/Globulin Ratio: 1.8 (ref 1.2–2.2)
Albumin: 4.7 g/dL (ref 3.8–4.8)
Alkaline Phosphatase: 52 IU/L (ref 44–121)
BUN/Creatinine Ratio: 13 (ref 10–24)
BUN: 18 mg/dL (ref 8–27)
Bilirubin Total: 0.3 mg/dL (ref 0.0–1.2)
CO2: 19 mmol/L — ABNORMAL LOW (ref 20–29)
Calcium: 9.6 mg/dL (ref 8.6–10.2)
Chloride: 101 mmol/L (ref 96–106)
Creatinine, Ser: 1.4 mg/dL — ABNORMAL HIGH (ref 0.76–1.27)
Globulin, Total: 2.6 g/dL (ref 1.5–4.5)
Glucose: 150 mg/dL — ABNORMAL HIGH (ref 70–99)
Potassium: 4.3 mmol/L (ref 3.5–5.2)
Sodium: 138 mmol/L (ref 134–144)
Total Protein: 7.3 g/dL (ref 6.0–8.5)
eGFR: 54 mL/min/{1.73_m2} — ABNORMAL LOW (ref >59)

## 2021-12-20 LAB — CBC WITH DIFFERENTIAL/PLATELET
Basophils Absolute: 0.1 10*3/uL (ref 0.0–0.2)
Basos: 1 %
EOS (ABSOLUTE): 1.1 10*3/uL — ABNORMAL HIGH (ref 0.0–0.4)
Eos: 12 %
Hematocrit: 42 % (ref 37.5–51.0)
Hemoglobin: 13.2 g/dL (ref 13.0–17.7)
Immature Grans (Abs): 0 10*3/uL (ref 0.0–0.1)
Immature Granulocytes: 0 %
Lymphocytes Absolute: 2.6 10*3/uL (ref 0.7–3.1)
Lymphs: 30 %
MCH: 28.2 pg (ref 26.6–33.0)
MCHC: 31.4 g/dL — ABNORMAL LOW (ref 31.5–35.7)
MCV: 90 fL (ref 79–97)
Monocytes Absolute: 0.9 10*3/uL (ref 0.1–0.9)
Monocytes: 10 %
Neutrophils Absolute: 4.3 10*3/uL (ref 1.4–7.0)
Neutrophils: 47 %
Platelets: 157 10*3/uL (ref 150–450)
RBC: 4.68 x10E6/uL (ref 4.14–5.80)
RDW: 13.1 % (ref 11.6–15.4)
WBC: 8.9 10*3/uL (ref 3.4–10.8)

## 2021-12-20 LAB — MICROALBUMIN / CREATININE URINE RATIO
Creatinine, Urine: 153.9 mg/dL
Microalb/Creat Ratio: 919 mg/g creat — ABNORMAL HIGH (ref 0–29)
Microalbumin, Urine: 1414 ug/mL

## 2021-12-20 LAB — HEMOGLOBIN A1C
Est. average glucose Bld gHb Est-mCnc: 143 mg/dL
Hgb A1c MFr Bld: 6.6 % — ABNORMAL HIGH (ref 4.8–5.6)

## 2021-12-26 ENCOUNTER — Ambulatory Visit (HOSPITAL_BASED_OUTPATIENT_CLINIC_OR_DEPARTMENT_OTHER): Payer: Medicare Other | Admitting: Family Medicine

## 2021-12-27 ENCOUNTER — Other Ambulatory Visit (HOSPITAL_BASED_OUTPATIENT_CLINIC_OR_DEPARTMENT_OTHER): Payer: Self-pay

## 2021-12-29 ENCOUNTER — Ambulatory Visit (HOSPITAL_BASED_OUTPATIENT_CLINIC_OR_DEPARTMENT_OTHER): Payer: Medicare Other | Admitting: Nurse Practitioner

## 2021-12-29 ENCOUNTER — Ambulatory Visit (INDEPENDENT_AMBULATORY_CARE_PROVIDER_SITE_OTHER): Payer: Medicare Other | Admitting: Family Medicine

## 2021-12-29 ENCOUNTER — Encounter (HOSPITAL_BASED_OUTPATIENT_CLINIC_OR_DEPARTMENT_OTHER): Payer: Self-pay | Admitting: Family Medicine

## 2021-12-29 VITALS — BP 178/67 | HR 61 | Ht 70.0 in | Wt 194.7 lb

## 2021-12-29 DIAGNOSIS — I10 Essential (primary) hypertension: Secondary | ICD-10-CM

## 2021-12-29 DIAGNOSIS — N1831 Chronic kidney disease, stage 3a: Secondary | ICD-10-CM

## 2021-12-29 DIAGNOSIS — R350 Frequency of micturition: Secondary | ICD-10-CM | POA: Insufficient documentation

## 2021-12-29 DIAGNOSIS — E1122 Type 2 diabetes mellitus with diabetic chronic kidney disease: Secondary | ICD-10-CM

## 2021-12-29 DIAGNOSIS — Z23 Encounter for immunization: Secondary | ICD-10-CM

## 2021-12-29 LAB — POCT URINALYSIS DIPSTICK
Bilirubin, UA: NEGATIVE
Glucose, UA: NEGATIVE
Ketones, UA: NEGATIVE
Leukocytes, UA: NEGATIVE
Nitrite, UA: NEGATIVE
Protein, UA: POSITIVE — AB
Spec Grav, UA: 1.025 (ref 1.010–1.025)
Urobilinogen, UA: 0.2 E.U./dL
pH, UA: 5.5 (ref 5.0–8.0)

## 2021-12-29 NOTE — Patient Instructions (Signed)
  Medication Instructions:  Your physician recommends that you continue on your current medications as directed. Please refer to the Current Medication list given to you today. --If you need a refill on any your medications before your next appointment, please call your pharmacy first. If no refills are authorized on file call the office.-- Lab Work: Your physician has recommended that you have lab work today: Yes, UA  If you have labs (blood work) drawn today and your tests are completely normal, you will receive your results via [facility] a phone call from our staff.  Please ensure you check your voicemail in the event that you authorized detailed messages to be left on a delegated number. If you have any lab test that is abnormal or we need to change your treatment, we will call you to review the results.  Referrals/Procedures/Imaging: Yes, Urology  Follow-Up: Your next appointment:   Your physician recommends that you schedule a follow-up appointment in: 3 months follow-up with Dr. de [place].  You will receive a text message or e-mail with a link to a survey about your care and experience with [place] today! We would greatly appreciate your feedback!   Thanks for letting us be apart of your health journey!!  Primary Care and Sports Medicine   Dr. Arlina Robes [place]   We encourage you to activate your patient portal called "MyChart".  Sign up information is provided on this After Visit Summary.  MyChart is used to connect with patients for Virtual Visits (Telemedicine).  Patients are able to view lab/test results, encounter notes, upcoming appointments, etc.  Non-urgent messages can be sent to your provider as well. To learn more about what you can do with MyChart, please visit --  [email or web address].

## 2021-12-29 NOTE — Progress Notes (Signed)
    Procedures performed today:    None.  Independent interpretation of notes and tests performed by another provider:   None.  Brief History, Exam, Impression, and Recommendations:    BP (!) 178/67   Pulse 61   Ht 5\' 10"  (1.778 m)   Wt 194 lb 11.2 oz (88.3 kg)   SpO2 100%   BMI 27.94 kg/m   Hypertension Initial blood pressure reading is elevated in office today, has improved some on recheck.  Patient has not been checking blood pressure regularly at home.  He denies any current issues related to chest pain or headaches.  He continues with losartan, atenolol.  At this time, we will not make any medication adjustments today, however we will need to continue to monitor closely and determine need for additional pharmacotherapy to better control blood pressure readings Recommend intermittent monitoring of blood pressure at home, DASH diet  Diabetes mellitus with chronic kidney disease ([place]) Patient continues with metformin, most recent hemoglobin A1c shows blood sugars are adequately controlled with A1c at 6.6%. He is due for check of urine microalbumin/creatinine ratio as this was slightly elevated in the past, we will check this today No changes to medications at this time Plan to complete foot exam at future visit  Urinary frequency Patient Clearence Cheek that he has been having some urinary frequency.  He denies any fever, chills, sweats.  Has not had any abdominal pain or back pain.  He has not noticed any pain or burning with urination.  No blood noted in the urine. On exam, patient is in no acute distress, vital signs stable. Urinalysis completed in the office today and findings are generally reassuring but with protein observed as well as small amount of blood which could be related to the proteinuria.  We will proceed with referral to urology for further evaluation and recommendations, referral placed today  Return in about 3 months (around 03/31/2022) for DM, HTN. Recommend seasonal  influenza vaccine, patient amenable, administered today   ___________________________________________ Edmon Magid de [place], MD, ABFM, CAQSM [facility] [place]

## 2022-02-07 ENCOUNTER — Other Ambulatory Visit (HOSPITAL_BASED_OUTPATIENT_CLINIC_OR_DEPARTMENT_OTHER): Payer: Self-pay

## 2022-02-07 ENCOUNTER — Other Ambulatory Visit (HOSPITAL_BASED_OUTPATIENT_CLINIC_OR_DEPARTMENT_OTHER): Payer: Self-pay | Admitting: Family Medicine

## 2022-02-07 MED ORDER — FAMOTIDINE 20 MG PO TABS
40.0000 mg | ORAL_TABLET | Freq: Every day | ORAL | 0 refills | Status: DC
  Filled 2022-02-07: qty 180, 90d supply, fill #0

## 2022-02-07 MED ORDER — ISOSORBIDE MONONITRATE ER 60 MG PO TB24
60.0000 mg | ORAL_TABLET | Freq: Every day | ORAL | 1 refills | Status: DC
  Filled 2022-02-07: qty 90, 90d supply, fill #0
  Filled 2022-05-10: qty 90, 90d supply, fill #1

## 2022-02-08 NOTE — Assessment & Plan Note (Signed)
Patient continues with metformin, most recent hemoglobin A1c shows blood sugars are adequately controlled with A1c at 6.6%. He is due for check of urine microalbumin/creatinine ratio as this was slightly elevated in the past, we will check this today No changes to medications at this time Plan to complete foot exam at future visit

## 2022-02-08 NOTE — Assessment & Plan Note (Signed)
Initial blood pressure reading is elevated in office today, has improved some on recheck.  Patient has not been checking blood pressure regularly at home.  He denies any current issues related to chest pain or headaches.  He continues with losartan, atenolol.  At this time, we will not make any medication adjustments today, however we will need to continue to monitor closely and determine need for additional pharmacotherapy to better control blood pressure readings Recommend intermittent monitoring of blood pressure at home, DASH diet

## 2022-02-08 NOTE — Assessment & Plan Note (Signed)
Patient Isabella Stalling that he has been having some urinary frequency.  He denies any fever, chills, sweats.  Has not had any abdominal pain or back pain.  He has not noticed any pain or burning with urination.  No blood noted in the urine. On exam, patient is in no acute distress, vital signs stable. Urinalysis completed in the office today and findings are generally reassuring but with protein observed as well as small amount of blood which could be related to the proteinuria.  We will proceed with referral to urology for further evaluation and recommendations, referral placed today

## 2022-02-14 ENCOUNTER — Other Ambulatory Visit (HOSPITAL_BASED_OUTPATIENT_CLINIC_OR_DEPARTMENT_OTHER): Payer: Self-pay

## 2022-02-14 ENCOUNTER — Other Ambulatory Visit (HOSPITAL_BASED_OUTPATIENT_CLINIC_OR_DEPARTMENT_OTHER): Payer: Self-pay | Admitting: Family Medicine

## 2022-02-14 MED ORDER — RIVAROXABAN 15 MG PO TABS
15.0000 mg | ORAL_TABLET | Freq: Every day | ORAL | 1 refills | Status: DC
  Filled 2022-02-14: qty 30, 30d supply, fill #0
  Filled 2022-03-21: qty 6, 6d supply, fill #1
  Filled 2022-03-28: qty 90, 90d supply, fill #2
  Filled 2022-03-28: qty 6, 6d supply, fill #2

## 2022-02-14 MED ORDER — NIFEDIPINE ER OSMOTIC RELEASE 60 MG PO TB24
60.0000 mg | ORAL_TABLET | Freq: Two times a day (BID) | ORAL | 0 refills | Status: DC
  Filled 2022-02-14: qty 180, 90d supply, fill #0

## 2022-02-14 MED ORDER — ROSUVASTATIN CALCIUM 20 MG PO TABS
20.0000 mg | ORAL_TABLET | Freq: Every day | ORAL | 1 refills | Status: DC
  Filled 2022-02-14: qty 90, 90d supply, fill #0
  Filled 2022-05-10: qty 90, 90d supply, fill #1

## 2022-02-22 DIAGNOSIS — R351 Nocturia: Secondary | ICD-10-CM | POA: Diagnosis not present

## 2022-02-22 DIAGNOSIS — R1084 Generalized abdominal pain: Secondary | ICD-10-CM | POA: Diagnosis not present

## 2022-02-22 DIAGNOSIS — R102 Pelvic and perineal pain: Secondary | ICD-10-CM | POA: Diagnosis not present

## 2022-02-22 DIAGNOSIS — Z125 Encounter for screening for malignant neoplasm of prostate: Secondary | ICD-10-CM | POA: Diagnosis not present

## 2022-02-22 DIAGNOSIS — R3915 Urgency of urination: Secondary | ICD-10-CM | POA: Diagnosis not present

## 2022-02-23 ENCOUNTER — Other Ambulatory Visit (HOSPITAL_BASED_OUTPATIENT_CLINIC_OR_DEPARTMENT_OTHER): Payer: Self-pay | Admitting: Family Medicine

## 2022-02-23 ENCOUNTER — Other Ambulatory Visit (HOSPITAL_BASED_OUTPATIENT_CLINIC_OR_DEPARTMENT_OTHER): Payer: Self-pay

## 2022-02-23 MED ORDER — TAMSULOSIN HCL 0.4 MG PO CAPS
0.4000 mg | ORAL_CAPSULE | Freq: Every day | ORAL | 1 refills | Status: DC
  Filled 2022-02-23: qty 90, 90d supply, fill #0

## 2022-03-07 ENCOUNTER — Other Ambulatory Visit (HOSPITAL_BASED_OUTPATIENT_CLINIC_OR_DEPARTMENT_OTHER): Payer: Self-pay

## 2022-03-13 IMAGING — DX DG CHEST 2V
2 series · 2 of 2 positions shown · non-contrast
Comparison: 12/31/2020

CLINICAL DATA: 71-year-old male with acute cough

EXAM:
CHEST - 2 VIEW

[dg chest 2 view (1 of 2)]
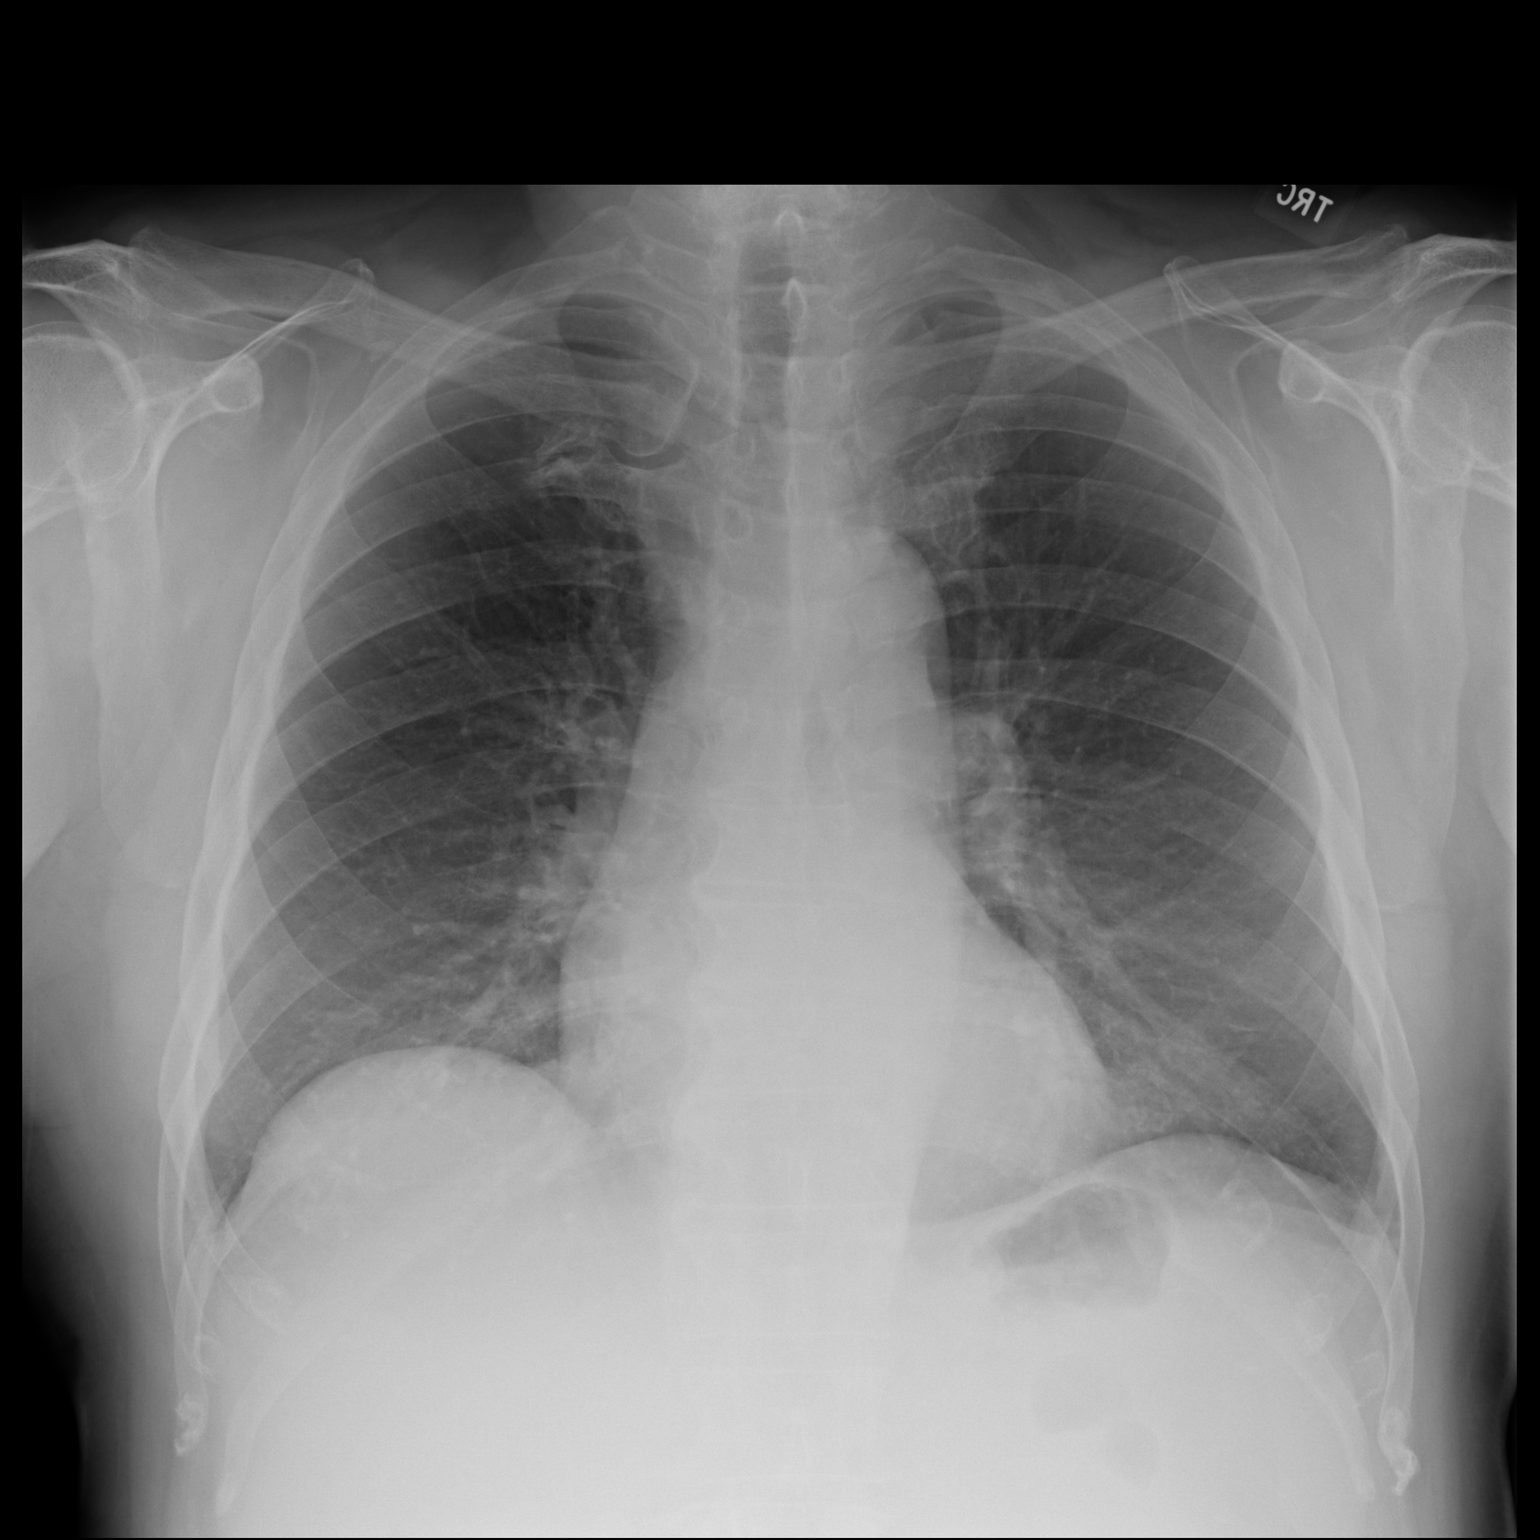

[dg chest 2 view (2 of 2)]
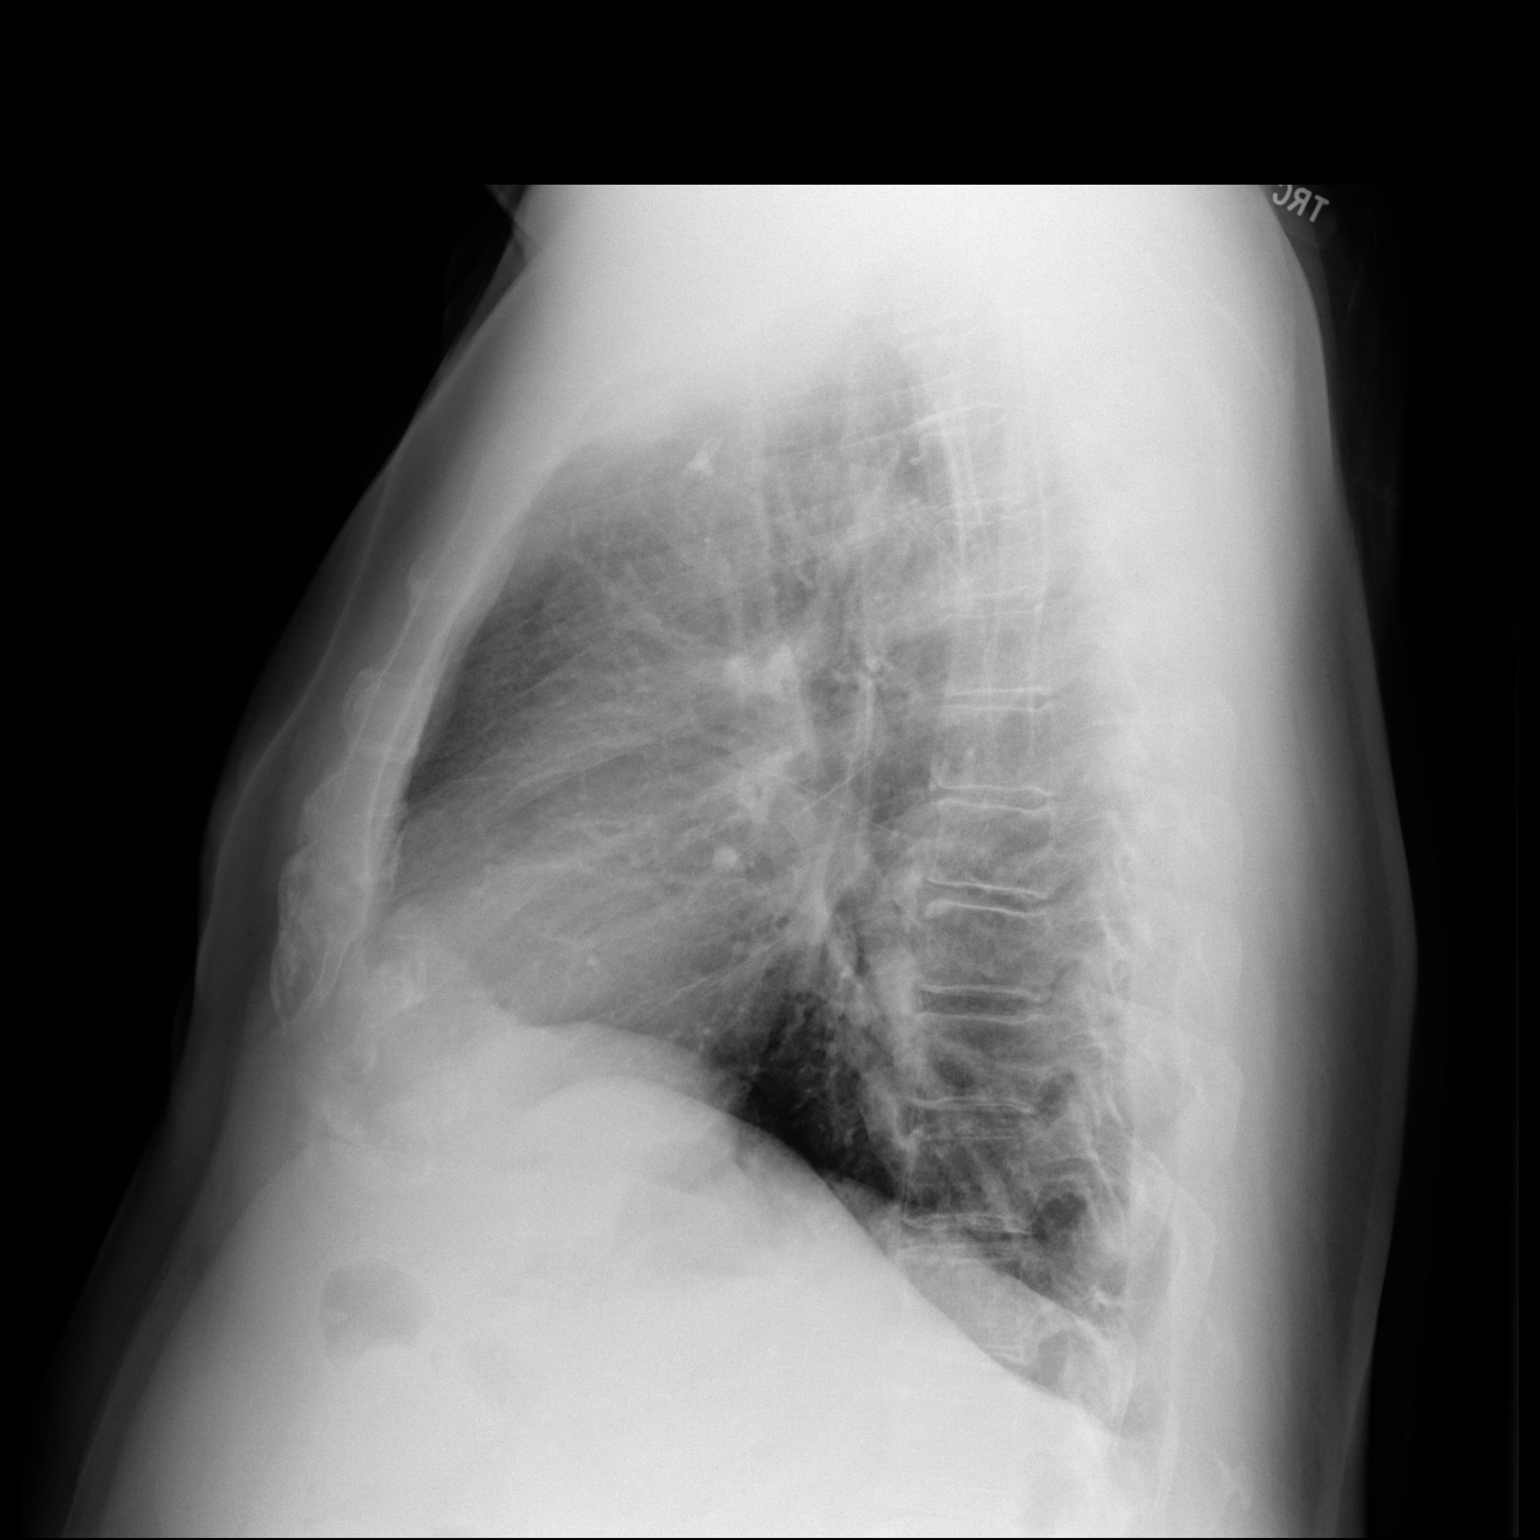

[2 of 2 positions shown; findings below may reference images not displayed]

FINDINGS: Cardiomediastinal silhouette unchanged in size and contour. No
evidence of central vascular congestion. No interlobular septal
thickening.

No pneumothorax or pleural effusion. Coarsened interstitial
markings, with no confluent airspace disease.

No acute displaced fracture. Degenerative changes of the spine.
IMPRESSION: No active cardiopulmonary disease.

## 2022-03-21 ENCOUNTER — Other Ambulatory Visit (HOSPITAL_BASED_OUTPATIENT_CLINIC_OR_DEPARTMENT_OTHER): Payer: Self-pay

## 2022-03-28 ENCOUNTER — Other Ambulatory Visit (HOSPITAL_BASED_OUTPATIENT_CLINIC_OR_DEPARTMENT_OTHER): Payer: Self-pay

## 2022-03-28 ENCOUNTER — Other Ambulatory Visit (HOSPITAL_BASED_OUTPATIENT_CLINIC_OR_DEPARTMENT_OTHER): Payer: Self-pay | Admitting: Family Medicine

## 2022-03-28 MED ORDER — ATENOLOL 100 MG PO TABS
100.0000 mg | ORAL_TABLET | Freq: Two times a day (BID) | ORAL | 1 refills | Status: DC
  Filled 2022-03-28: qty 180, 90d supply, fill #0
  Filled 2022-06-27: qty 180, 90d supply, fill #1

## 2022-04-04 ENCOUNTER — Ambulatory Visit (HOSPITAL_BASED_OUTPATIENT_CLINIC_OR_DEPARTMENT_OTHER): Payer: Medicare Other | Admitting: Family Medicine

## 2022-04-11 DIAGNOSIS — M62838 Other muscle spasm: Secondary | ICD-10-CM | POA: Diagnosis not present

## 2022-04-11 DIAGNOSIS — R102 Pelvic and perineal pain: Secondary | ICD-10-CM | POA: Diagnosis not present

## 2022-04-11 DIAGNOSIS — M6281 Muscle weakness (generalized): Secondary | ICD-10-CM | POA: Diagnosis not present

## 2022-04-11 DIAGNOSIS — M6289 Other specified disorders of muscle: Secondary | ICD-10-CM | POA: Diagnosis not present

## 2022-04-19 ENCOUNTER — Other Ambulatory Visit (HOSPITAL_BASED_OUTPATIENT_CLINIC_OR_DEPARTMENT_OTHER): Payer: Self-pay

## 2022-04-19 DIAGNOSIS — N401 Enlarged prostate with lower urinary tract symptoms: Secondary | ICD-10-CM | POA: Diagnosis not present

## 2022-04-19 DIAGNOSIS — N281 Cyst of kidney, acquired: Secondary | ICD-10-CM | POA: Diagnosis not present

## 2022-04-19 DIAGNOSIS — N5201 Erectile dysfunction due to arterial insufficiency: Secondary | ICD-10-CM | POA: Diagnosis not present

## 2022-04-19 DIAGNOSIS — R351 Nocturia: Secondary | ICD-10-CM | POA: Diagnosis not present

## 2022-04-19 MED ORDER — SILDENAFIL CITRATE 100 MG PO TABS
100.0000 mg | ORAL_TABLET | Freq: Every day | ORAL | 1 refills | Status: DC | PRN
  Filled 2022-04-19: qty 4, 30d supply, fill #0

## 2022-04-19 MED ORDER — ALFUZOSIN HCL ER 10 MG PO TB24
10.0000 mg | ORAL_TABLET | Freq: Every day | ORAL | 3 refills | Status: DC
  Filled 2022-04-19: qty 30, 30d supply, fill #0

## 2022-05-08 DIAGNOSIS — K080 Exfoliation of teeth due to systemic causes: Secondary | ICD-10-CM | POA: Diagnosis not present

## 2022-05-09 DIAGNOSIS — H524 Presbyopia: Secondary | ICD-10-CM | POA: Diagnosis not present

## 2022-05-10 ENCOUNTER — Other Ambulatory Visit (HOSPITAL_BASED_OUTPATIENT_CLINIC_OR_DEPARTMENT_OTHER): Payer: Self-pay | Admitting: Family Medicine

## 2022-05-10 ENCOUNTER — Other Ambulatory Visit (HOSPITAL_BASED_OUTPATIENT_CLINIC_OR_DEPARTMENT_OTHER): Payer: Self-pay

## 2022-05-10 ENCOUNTER — Other Ambulatory Visit: Payer: Self-pay

## 2022-05-10 MED ORDER — FAMOTIDINE 20 MG PO TABS
40.0000 mg | ORAL_TABLET | Freq: Every day | ORAL | 0 refills | Status: DC
  Filled 2022-05-10: qty 180, 90d supply, fill #0

## 2022-05-11 DIAGNOSIS — N4289 Other specified disorders of prostate: Secondary | ICD-10-CM | POA: Insufficient documentation

## 2022-05-12 ENCOUNTER — Other Ambulatory Visit: Payer: Self-pay

## 2022-05-16 ENCOUNTER — Other Ambulatory Visit (HOSPITAL_BASED_OUTPATIENT_CLINIC_OR_DEPARTMENT_OTHER): Payer: Self-pay

## 2022-05-16 ENCOUNTER — Ambulatory Visit (INDEPENDENT_AMBULATORY_CARE_PROVIDER_SITE_OTHER): Payer: Medicare Other | Admitting: Family Medicine

## 2022-05-16 ENCOUNTER — Encounter (HOSPITAL_BASED_OUTPATIENT_CLINIC_OR_DEPARTMENT_OTHER): Payer: Self-pay | Admitting: Family Medicine

## 2022-05-16 VITALS — BP 148/66 | HR 52 | Ht 70.0 in | Wt 200.8 lb

## 2022-05-16 DIAGNOSIS — J0140 Acute pansinusitis, unspecified: Secondary | ICD-10-CM | POA: Diagnosis not present

## 2022-05-16 DIAGNOSIS — I10 Essential (primary) hypertension: Secondary | ICD-10-CM

## 2022-05-16 DIAGNOSIS — N1831 Chronic kidney disease, stage 3a: Secondary | ICD-10-CM | POA: Diagnosis not present

## 2022-05-16 DIAGNOSIS — E1122 Type 2 diabetes mellitus with diabetic chronic kidney disease: Secondary | ICD-10-CM | POA: Diagnosis not present

## 2022-05-16 MED ORDER — AMOXICILLIN-POT CLAVULANATE 875-125 MG PO TABS
1.0000 | ORAL_TABLET | Freq: Two times a day (BID) | ORAL | 0 refills | Status: DC
  Filled 2022-05-16: qty 20, 10d supply, fill #0

## 2022-05-16 NOTE — Assessment & Plan Note (Addendum)
Endorses previous URI with lingering sinus pain and congestion.  Left maxillary sinus pain upon physical exam, symptoms consistent with sinusitis.  Recommend sinus rinses, Augmentin 875-125 mg twice per day for 10 days prescribed.  He will follow-up if symptoms do not improve.

## 2022-05-16 NOTE — Patient Instructions (Signed)
You were prescribed an antibiotic today. Please complete the full course.  Acetaminophen or ibuprofen for fever according to package instructions, do not exceed recommended doses.  Adequate fluids to avoid dehydration. Lots of rest while you are recovering.  Follow-up if your symptoms do not improve.

## 2022-05-16 NOTE — Assessment & Plan Note (Signed)
Taking metformin 500 mg twice per day as prescribed.  Denies chest pain, shortness of breath, vision changes, polydipsia, polyphagia, polyuria.  Endorses feelings of hypoglycemia at times, he reports that he does not check his blood sugar but he eats and drinks and then it resolves quickly.  Last A1c done 11/2521 6.6.  He reports he does not check his blood sugars at home.  He will continue with metformin 500 mg p.o. twice daily with meals will check A1c today.  He will follow-up with PCP in 3 months.

## 2022-05-16 NOTE — Progress Notes (Signed)
Established Patient Office Visit  Subjective   Patient ID: Frederick Tyler, male    DOB: [birth year]  Age: 72 y.o. MRN: [id number]  Chief Complaint  Patient presents with   Follow-up    Pt here for f/u on DM and HTN     HPI  Hypertension Medication compliance: atenolol 100 mg and losartan 50 mg as prescribed.  Denies chest pain, shortness of breath, lower extremity edema, vision changes, headaches.  Pertinent lab work: 12/19/21 CMP  Monitoring at home: 135-145/70  Tolerating medication well: no side effects  Continue current medication regimen: atenolol 100 mg and losartan 50 mg  Follow-up: 3 months  No refills needed.   Diabetes: Medication compliance: metformin 500 mg BID as prescribed.  Denies chest pain, shortness of breath, vision changes, polydipsia, polyphagia, polyuria. Endorses feeling like  hypoglycemia.  Pertinent lab work: A1C:  12/19/21 A1C 6.6 Monitoring: blood sugar readings at home: Does not check blood sugar at home.           Continue current medication regimen: metformin 500 mg BID with meals   Well controlled: will check A1C  Follow-up: 3 months   Sinus pain and congestion present for 3 weeks. Prior URI before symptoms started.  Tried nothing for  symptoms.   Review of Systems  HENT:  Positive for sinus pain.   Eyes:  Negative for blurred vision and double vision.  Respiratory:  Negative for shortness of breath.   Cardiovascular:  Negative for chest pain and leg swelling.  Neurological:  Positive for headaches (when started flomax per urology. has improved once off of flomax.).      Objective:     BP (!) 148/66   Pulse (!) 52   Ht 5' 10"$  (1.778 m)   Wt 200 lb 12.8 oz (91.1 kg)   SpO2 99%   BMI 28.81 kg/m  BP Readings from Last 3 Encounters:  05/16/22 (!) 148/66  12/29/21 (!) 178/67  10/25/21 (!) 139/58      Physical Exam Vitals and nursing note reviewed.  Constitutional:      General: He is not in acute distress.    Appearance: Normal  appearance.  HENT:     Nose:     Right Sinus: Maxillary sinus tenderness present.     Left Sinus: Maxillary sinus tenderness present.  Cardiovascular:     Rate and Rhythm: Regular rhythm.     Heart sounds: Normal heart sounds.  Pulmonary:     Effort: Pulmonary effort is normal.     Breath sounds: Normal breath sounds.  Skin:    General: Skin is warm and dry.     Capillary Refill: Capillary refill takes less than 2 seconds.  Neurological:     Mental Status: He is alert.  Psychiatric:        Mood and Affect: Mood normal.        Behavior: Behavior normal.        Thought Content: Thought content normal.        Judgment: Judgment normal.     No results found for any visits on 05/16/22.  Last metabolic panel Lab Results  Component Value Date   GLUCOSE 150 (H) 12/19/2021   NA 138 12/19/2021   K 4.3 12/19/2021   CL 101 12/19/2021   CO2 19 (L) 12/19/2021   BUN 18 12/19/2021   CREATININE 1.40 (H) 12/19/2021   EGFR 54 (L) 12/19/2021   CALCIUM 9.6 12/19/2021   PROT 7.3 12/19/2021   ALBUMIN 4.7 12/19/2021  LABGLOB 2.6 12/19/2021   AGRATIO 1.8 12/19/2021   BILITOT 0.3 12/19/2021   ALKPHOS 52 12/19/2021   AST 17 12/19/2021   ALT 20 12/19/2021   ANIONGAP 13 12/31/2020   Last hemoglobin A1c Lab Results  Component Value Date   HGBA1C 6.6 (H) 12/19/2021      The ASCVD Risk score (Arnett DK, et al., 2019) failed to calculate for the following reasons:   The valid total cholesterol range is 130 to 320 mg/dL    Assessment & Plan:   Problem List Items Addressed This Visit     Diabetes mellitus with chronic kidney disease ([place])    Taking metformin 500 mg twice per day as prescribed.  Denies chest pain, shortness of breath, vision changes, polydipsia, polyphagia, polyuria.  Endorses feelings of hypoglycemia at times, he reports that he does not check his blood sugar but he eats and drinks and then it resolves quickly.  Last A1c done 11/2521 6.6.  He reports he does not check  his blood sugars at home.  He will continue with metformin 500 mg p.o. twice daily with meals will check A1c today.  He will follow-up with PCP in 3 months.      Relevant Orders   Comprehensive metabolic panel   Hemoglobin A1c   Hypertension - Primary    Taking atenolol 100 mg and losartan 50 mg as prescribed.  Denies chest pain, shortness of breath, lower extremity edema, vision changes and headaches.  Last CMP done in September will recheck today.  Monitors his blood pressure at home reports that his readings are 135-145/70 He denies any side effects of his medications, he is tolerating them well.  Will continue his current regimen.  He will follow-up with his PCP in 3 months.  Based on his home monitoring his blood pressures are near goal.  No refills needed today.      Relevant Orders   Comprehensive metabolic panel   Acute non-recurrent pansinusitis    Endorses previous URI with lingering sinus pain and congestion.  Left maxillary sinus pain upon physical exam, symptoms consistent with sinusitis.  Recommend sinus rinses, Augmentin 875-125 mg twice per day for 10 days prescribed.  He will follow-up if symptoms do not improve.      Relevant Medications   amoxicillin-clavulanate (AUGMENTIN) 875-125 MG tablet  Agrees with plan of care discussed.  Questions answered.   Return in about 3 months (around 08/14/2022) for DM, HTN .    Chalmers Guest, FNP

## 2022-05-16 NOTE — Assessment & Plan Note (Signed)
Taking atenolol 100 mg and losartan 50 mg as prescribed.  Denies chest pain, shortness of breath, lower extremity edema, vision changes and headaches.  Last CMP done in September will recheck today.  Monitors his blood pressure at home reports that his readings are 135-145/70 He denies any side effects of his medications, he is tolerating them well.  Will continue his current regimen.  He will follow-up with his PCP in 3 months.  Based on his home monitoring his blood pressures are near goal.  No refills needed today.

## 2022-05-17 DIAGNOSIS — M62838 Other muscle spasm: Secondary | ICD-10-CM | POA: Diagnosis not present

## 2022-05-17 DIAGNOSIS — M6281 Muscle weakness (generalized): Secondary | ICD-10-CM | POA: Diagnosis not present

## 2022-05-17 DIAGNOSIS — R102 Pelvic and perineal pain: Secondary | ICD-10-CM | POA: Diagnosis not present

## 2022-05-17 DIAGNOSIS — M6289 Other specified disorders of muscle: Secondary | ICD-10-CM | POA: Diagnosis not present

## 2022-05-17 LAB — COMPREHENSIVE METABOLIC PANEL
ALT: 14 IU/L (ref 0–44)
AST: 17 IU/L (ref 0–40)
Albumin/Globulin Ratio: 1.7 (ref 1.2–2.2)
Albumin: 4.5 g/dL (ref 3.8–4.8)
Alkaline Phosphatase: 55 IU/L (ref 44–121)
BUN/Creatinine Ratio: 15 (ref 10–24)
BUN: 19 mg/dL (ref 8–27)
Bilirubin Total: 0.3 mg/dL (ref 0.0–1.2)
CO2: 21 mmol/L (ref 20–29)
Calcium: 9.7 mg/dL (ref 8.6–10.2)
Chloride: 101 mmol/L (ref 96–106)
Creatinine, Ser: 1.3 mg/dL — ABNORMAL HIGH (ref 0.76–1.27)
Globulin, Total: 2.7 g/dL (ref 1.5–4.5)
Glucose: 123 mg/dL — ABNORMAL HIGH (ref 70–99)
Potassium: 4.2 mmol/L (ref 3.5–5.2)
Sodium: 139 mmol/L (ref 134–144)
Total Protein: 7.2 g/dL (ref 6.0–8.5)
eGFR: 58 mL/min/{1.73_m2} — ABNORMAL LOW (ref >59)

## 2022-05-17 LAB — HEMOGLOBIN A1C
Est. average glucose Bld gHb Est-mCnc: 137 mg/dL
Hgb A1c MFr Bld: 6.4 % — ABNORMAL HIGH (ref 4.8–5.6)

## 2022-05-18 ENCOUNTER — Other Ambulatory Visit (HOSPITAL_BASED_OUTPATIENT_CLINIC_OR_DEPARTMENT_OTHER): Payer: Self-pay

## 2022-05-23 ENCOUNTER — Other Ambulatory Visit (HOSPITAL_BASED_OUTPATIENT_CLINIC_OR_DEPARTMENT_OTHER): Payer: Self-pay

## 2022-05-26 ENCOUNTER — Other Ambulatory Visit (HOSPITAL_BASED_OUTPATIENT_CLINIC_OR_DEPARTMENT_OTHER): Payer: Self-pay

## 2022-05-26 ENCOUNTER — Other Ambulatory Visit (HOSPITAL_BASED_OUTPATIENT_CLINIC_OR_DEPARTMENT_OTHER): Payer: Self-pay | Admitting: Family Medicine

## 2022-05-26 DIAGNOSIS — R002 Palpitations: Secondary | ICD-10-CM | POA: Insufficient documentation

## 2022-05-26 DIAGNOSIS — I10 Essential (primary) hypertension: Secondary | ICD-10-CM

## 2022-05-27 ENCOUNTER — Other Ambulatory Visit (HOSPITAL_BASED_OUTPATIENT_CLINIC_OR_DEPARTMENT_OTHER): Payer: Self-pay

## 2022-05-29 ENCOUNTER — Other Ambulatory Visit (HOSPITAL_BASED_OUTPATIENT_CLINIC_OR_DEPARTMENT_OTHER): Payer: Self-pay

## 2022-05-29 MED ORDER — NIFEDIPINE ER OSMOTIC RELEASE 60 MG PO TB24
60.0000 mg | ORAL_TABLET | Freq: Two times a day (BID) | ORAL | 0 refills | Status: DC
  Filled 2022-05-29: qty 180, 90d supply, fill #0

## 2022-05-29 MED ORDER — LOSARTAN POTASSIUM 50 MG PO TABS
50.0000 mg | ORAL_TABLET | Freq: Two times a day (BID) | ORAL | 1 refills | Status: DC
  Filled 2022-05-29: qty 180, 90d supply, fill #0
  Filled 2022-08-25: qty 180, 90d supply, fill #1

## 2022-06-01 ENCOUNTER — Other Ambulatory Visit (HOSPITAL_BASED_OUTPATIENT_CLINIC_OR_DEPARTMENT_OTHER): Payer: Self-pay | Admitting: Family Medicine

## 2022-06-01 ENCOUNTER — Other Ambulatory Visit (HOSPITAL_BASED_OUTPATIENT_CLINIC_OR_DEPARTMENT_OTHER): Payer: Self-pay

## 2022-06-01 MED ORDER — METFORMIN HCL 500 MG PO TABS
500.0000 mg | ORAL_TABLET | Freq: Two times a day (BID) | ORAL | 1 refills | Status: DC
  Filled 2022-06-01: qty 180, 90d supply, fill #0
  Filled 2022-08-31: qty 180, 90d supply, fill #1

## 2022-06-12 ENCOUNTER — Telehealth (HOSPITAL_BASED_OUTPATIENT_CLINIC_OR_DEPARTMENT_OTHER): Payer: Self-pay | Admitting: Family Medicine

## 2022-06-12 DIAGNOSIS — I10 Essential (primary) hypertension: Secondary | ICD-10-CM

## 2022-06-12 DIAGNOSIS — R002 Palpitations: Secondary | ICD-10-CM

## 2022-06-12 NOTE — Telephone Encounter (Signed)
Pt came in states he asked Santiago Glad for a referral to Cardiology downstairs.  He is having heart palpatations.  He has not heard anything --I dont show a referral

## 2022-06-21 ENCOUNTER — Other Ambulatory Visit (HOSPITAL_BASED_OUTPATIENT_CLINIC_OR_DEPARTMENT_OTHER): Payer: Self-pay

## 2022-06-21 ENCOUNTER — Other Ambulatory Visit (HOSPITAL_BASED_OUTPATIENT_CLINIC_OR_DEPARTMENT_OTHER): Payer: Self-pay | Admitting: Family Medicine

## 2022-06-21 MED ORDER — RIVAROXABAN 15 MG PO TABS
15.0000 mg | ORAL_TABLET | Freq: Every day | ORAL | 1 refills | Status: DC
  Filled 2022-06-21: qty 90, 90d supply, fill #0
  Filled 2022-09-14: qty 90, 90d supply, fill #1

## 2022-06-23 ENCOUNTER — Other Ambulatory Visit (HOSPITAL_BASED_OUTPATIENT_CLINIC_OR_DEPARTMENT_OTHER): Payer: Self-pay

## 2022-06-27 DIAGNOSIS — R200 Anesthesia of skin: Secondary | ICD-10-CM | POA: Insufficient documentation

## 2022-07-11 NOTE — Progress Notes (Signed)
Cardiology Office Note:    Date:  07/12/2022   ID:  Frederick Tyler, DOB [birth year], MRN [id number]  PCP:  de Peru, Raymond J, MD  Cardiologist:  Jodelle Red, MD  Referring MD: de Peru, Buren Kos, MD   CC: new patient consultation for palpitations  History of Present Illness:    Frederick Tyler is a 72 y.o. male with a hx of hypertension who is seen as a new consult at the request of de Peru, Buren Kos, MD for the evaluation and management of palpitations.  Notes from Dr. Ihor Dow reviewed. Most of his history is through [facility], records requested today.  Tachycardia/palpitations: -Initial onset: 3-4 years ago  -Frequency/Duration: 1-2 times a week, when stressed -Associated symptoms: none -Aggravating/alleviating factors: stress -Syncope/near syncope: None -Prior cardiac history: Hx of hypertension and TIAs. He reports some blockages, but notes it is not severe enough to have stents placed.  -Prior workup: has worn monitor. Has had catheterization procedures, echo, and stress tests -Caffeine: occasionally drinks tea -Alcohol: about 3 glasses a night, occasionally more -Tobacco: former smoker, quit about 25 years ago -Comorbidities: prediabetes, hypertension -Exercise level: walks 2 miles a day -Labs: TSH, kidney function/electrolytes, CBC reviewed. -Cardiac ROS: no PND, no orthopnea, no LE edema. -Family history: His mother and father both had heart problems. Mother passed at 41 and father passed at 6. Brother has had open-heart surgery 3 times and is currently on the heart transplant list. His younger brother has had a quadruple bypass, has had stents placed, and now has a pacemaker.   Today, he complains of chest heaviness and pressure, which tends to occur in the mornings. These episodes tend to last about 3-5 minutes and often come and go.   He also complains of left foot numbness and left arm pain. He experiences this pain almost every day and notes it does not  occur at a specific time of day. He feels a tightness in his legs while walking. He was encouraged to walk at least a mile a day and he has been walking about 2 miles a day. He sometimes stops to stretch when he experiences any leg tightness.   He has headaches often with slight vision changes. Due to his history of TIA he has lost a bit of peripheral vision in his left eye.   He experiences shortness of breath with exertion like walking and walking up steps.   He has a history of hypertension for about 15 years. He monitors his blood pressure at home and reports average readings of 133-143/80s.   He denies any peripheral edema. No lightheadedness, syncope, orthopnea, or PND.  Past Medical History:  Diagnosis Date   Chronic kidney disease    GERD (gastroesophageal reflux disease)    Hypertension     History reviewed. No pertinent surgical history.  Current Medications: Current Outpatient Medications on File Prior to Visit  Medication Sig   aspirin 81 MG chewable tablet Chew 1 tablet (81 mg total) by mouth daily.   atenolol (TENORMIN) 100 MG tablet Take 1 tablet (100 mg total) by mouth 2 (two) times daily.   famotidine (PEPCID) 20 MG tablet Take 2 tablets (40 mg total) by mouth at bedtime.   isosorbide mononitrate (IMDUR) 60 MG 24 hr tablet Take 1 tablet (60 mg total) by mouth daily.   losartan (COZAAR) 50 MG tablet Take 1 tablet (50 mg total) by mouth in the morning and at bedtime.   metFORMIN (GLUCOPHAGE) 500 MG tablet Take 1 tablet (  500 mg total) by mouth 2 (two) times daily with a meal.   NIFEdipine (PROCARDIA XL/NIFEDICAL XL) 60 MG 24 hr tablet Take 1 tablet (60 mg total) by mouth in the morning and at bedtime.   nitroGLYCERIN (NITROSTAT) 0.4 MG SL tablet Place 0.4 mg under the tongue every 5 (five) minutes as needed for chest pain (max dose of 3. At initiation of 3rd dose call 911.).   Probiotic Product (PROBIOTIC BLEND PO) Take by mouth. NuZymes   Rivaroxaban (XARELTO) 15 MG TABS  tablet Take 1 tablet (15 mg total) by mouth daily with supper.   rosuvastatin (CRESTOR) 20 MG tablet Take 1 tablet (20 mg total) by mouth daily.   albuterol (VENTOLIN HFA) 108 (90 Base) MCG/ACT inhaler Inhale 1-2 puffs into the lungs every 6 (six) hours as needed for wheezing or shortness of breath.   No current facility-administered medications on file prior to visit.     Allergies:   Grass pollen([id number]-swt vern)   Social History   Tobacco Use   Smoking status: Former    Types: Cigarettes   Smokeless tobacco: Never  Vaping Use   Vaping Use: Never used  Substance Use Topics   Alcohol use: Never   Drug use: Never    Family History: His mother and father both had heart problems. Mother passed at 3 and father passed at 52. Brother has had open-heart surgery 3 times and is currently on the heart transplant list. His younger brother has had a quadruple bypass, has had stents placed, and now has a pacemaker.   ROS:   Please see the history of present illness.  Additional pertinent ROS: Constitutional: Negative for chills, fever, night sweats, unintentional weight loss  HENT: Negative for ear pain and hearing loss.   Eyes: Negative for loss of vision and eye pain.  Respiratory: Negative for cough, sputum, wheezing.   Cardiovascular: See HPI. Gastrointestinal: Negative for abdominal pain, melena, and hematochezia.  Genitourinary: Negative for dysuria and hematuria.  Musculoskeletal: Negative for falls. Left arm pain and Left leg tightness.  Skin: Negative for itching and rash.  Neurological: Negative for focal weakness, focal sensory changes and loss of consciousness.  Endo/Heme/Allergies: Does not bruise/bleed easily.     EKGs/Labs/Other Studies Reviewed:    The following studies were reviewed today: N/A. Requesting records.   EKG:  EKG is personally reviewed.   07/12/2022: Sinus rhythm with 1st degree block. Rate 60 bpm.  Recent Labs: 12/19/2021: Hemoglobin 13.2; Platelets  157 05/16/2022: ALT 14; BUN 19; Creatinine, Ser 1.30; Potassium 4.2; Sodium 139  Recent Lipid Panel    Component Value Date/Time   CHOL 112 12/19/2021 1033   TRIG 236 (H) 12/19/2021 1033   HDL 42 12/19/2021 1033   CHOLHDL 2.7 12/19/2021 1033   CHOLHDL 2.4 06/28/2020 1448   VLDL 47 (H) 06/28/2020 1448   LDLCALC 34 12/19/2021 1033    Physical Exam:    VS:  BP (!) 144/62 (BP Location: Left Arm, Patient Position: Sitting, Cuff Size: Normal)   Pulse 60   Ht 5\' 10"  (1.778 m)   Wt 199 lb 14.4 oz (90.7 kg)   BMI 28.68 kg/m     Wt Readings from Last 3 Encounters:  07/12/22 199 lb 14.4 oz (90.7 kg)  05/16/22 200 lb 12.8 oz (91.1 kg)  12/29/21 194 lb 11.2 oz (88.3 kg)    GEN: Well nourished, well developed in no acute distress HEENT: Normal, moist mucous membranes NECK: No JVD CARDIAC: regular rhythm, normal S1 and  S2, no rubs or gallops. 1/6 murmur. VASCULAR: Radial and DP pulses 2+ bilaterally. No carotid bruits RESPIRATORY:  Clear to auscultation without rales, wheezing or rhonchi  ABDOMEN: Soft, non-tender, non-distended MUSCULOSKELETAL:  Ambulates independently SKIN: Warm and dry, no edema NEUROLOGIC:  Alert and oriented x 3. No focal neuro deficits noted. PSYCHIATRIC:  Normal affect    ASSESSMENT:    1. Essential hypertension   2. Heart palpitations   3. Type 2 diabetes mellitus with stage 3a chronic kidney disease, without long-term current use of insulin   4. Cardiac risk counseling   5. Counseling on health promotion and disease prevention   6. Chest pain of uncertain etiology   7. Dyspnea on exertion   8. Nonocclusive coronary atherosclerosis of native coronary artery   9. Claudication in peripheral vascular disease   10. Hx of transient ischemic attack (TIA)   11. Bilateral carotid bruits    PLAN:    CAD Chest pain Shortness of breath on exertion PAD with claudication History of TIA-->cerebellar CVA based on records Bilateral carotid bruits -has been on  rivaroxaban 15 mg after TIA (?CVA) from neurology. No clear history of afib. Requesting records. Consider loop if unclear -on aspirin as well -ordered cardiac PET, ABI, carotids, LE arterial today Informed Consent   Shared Decision Making/Informed Consent The risks [chest pain, shortness of breath, cardiac arrhythmias, dizziness, blood pressure fluctuations, myocardial infarction, stroke/transient ischemic attack, nausea, vomiting, allergic reaction, radiation exposure, metallic taste sensation and life-threatening complications (estimated to be 1 in 10,000)], benefits (risk stratification, diagnosing coronary artery disease, treatment guidance) and alternatives of a cardiac PET stress test were discussed in detail with Mr. Mcgahee and he agrees to proceed.      -walks extensively to keep PAD managed. No rest pain or nonhealing wounds. -continue aspirin 81 mg daily (though caution given Xarelto as above) -continue rosuvastatin 20 mg daily. LDL 34 -continue beta blocker, imdur -check lp(a) if not done previously at [facility] -reviewed red flag warning signs that need immediate medical attention   Hypertension -continue imdur, losartan, nifedipine, atenolol   Palpitations -if not improved at follow up, consider monitor -continue beta blocker   Type II diabetes with stage 3a CKD -would typically consider SGLT2i, though given PAD would avoid canagliflozin -current on metformin -re-address at follow up  Plan for follow up: 8-10 weeks, or sooner if needed  Jodelle Red, MD, PhD, [facility]  [facility]  [place]  [facility] at [facility] [address] [place], [place] [address] [phone]   Medication Adjustments/Labs and Tests Ordered: Current medicines are reviewed at length with the patient today.  Concerns regarding medicines are outlined above.  Orders Placed This Encounter  Procedures   NM PET CT  CARDIAC PERFUSION MULTI W/ABSOLUTE BLOODFLOW   Basic metabolic panel   EKG 12-Lead   VAS [place] ABI WITH/WO TBI   VAS [place] LOWER EXTREMITY ARTERIAL DUPLEX   VAS US CAROTID   No orders of the defined types were placed in this encounter.  Patient Instructions  Medication Instructions:  Your physician recommends that you continue on your current medications as directed. Please refer to the Current Medication list given to you today.   *If you need a refill on your cardiac medications before your next appointment, please call your pharmacy*  Lab Work: BMET 1 WEEK PRIOR TO PET SCAN   If you have labs (blood work) drawn today and your tests are completely normal,  you will receive your results only by: MyChart Message (if you have MyChart) OR A paper copy in the mail If you have any lab test that is abnormal or we need to change your treatment, we will call you to review the results.  Testing/Procedures: Your physician has requested that you have a carotid duplex. This test is an ultrasound of the carotid arteries in your neck. It looks at blood flow through these arteries that supply the brain with blood. Allow one hour for this exam. There are no restrictions or special instructions.  Your physician has requested that you have an ankle brachial index (ABI). During this test an ultrasound and blood pressure cuff are used to evaluate the arteries that supply the arms and legs with blood. Allow thirty minutes for this exam. There are no restrictions or special instructions.  Your physician has requested that you have a lower extremity arterial duplex. This test is an ultrasound of the arteries in the legs or arms. It looks at arterial blood flow in the legs and arms. Allow one hour for Lower and Upper Arterial scans. There are no restrictions or special instructions  CARDIAC PET SCAN  THE OFFICE WILL CALL YOU TO SCHEDULE ONCE YOUR INSURANCE HAS BEEN REVIEWED   Follow-Up: At [facility],  you and your health needs are our priority.  As part of our continuing mission to provide you with exceptional heart care, we have created designated Provider Care Teams.  These Care Teams include your primary Cardiologist (physician) and Advanced Practice Providers (APPs -  Physician Assistants and Nurse Practitioners) who all work together to provide you with the care you need, when you need it.  We recommend signing up for the patient portal called "MyChart".  Sign up information is provided on this After Visit Summary.  MyChart is used to connect with patients for Virtual Visits (Telemedicine).  Patients are able to view lab/test results, encounter notes, upcoming appointments, etc.  Non-urgent messages can be sent to your provider as well.   To learn more about what you can do with MyChart, go to [email or web address].    Your next appointment:   8 week(s)  Provider:   Jodelle Red, MD           I,Rachel Rivera,acting as a scribe for Jodelle Red, MD.,have documented all relevant documentation on the behalf of Jodelle Red, MD,as directed by  Jodelle Red, MD while in the presence of Jodelle Red, MD.  I, Jodelle Red, MD, have reviewed all documentation for this visit. The documentation on 09/06/22 for the exam, diagnosis, procedures, and orders are all accurate and complete.   Signed, Jodelle Red, MD PhD 07/12/2022 10:54 AM    [facility]

## 2022-07-12 ENCOUNTER — Ambulatory Visit (HOSPITAL_BASED_OUTPATIENT_CLINIC_OR_DEPARTMENT_OTHER): Payer: Medicare Other | Admitting: Cardiology

## 2022-07-12 ENCOUNTER — Encounter (HOSPITAL_BASED_OUTPATIENT_CLINIC_OR_DEPARTMENT_OTHER): Payer: Self-pay | Admitting: Cardiology

## 2022-07-12 VITALS — BP 144/62 | HR 60 | Ht 70.0 in | Wt 199.9 lb

## 2022-07-12 DIAGNOSIS — I739 Peripheral vascular disease, unspecified: Secondary | ICD-10-CM

## 2022-07-12 DIAGNOSIS — E1122 Type 2 diabetes mellitus with diabetic chronic kidney disease: Secondary | ICD-10-CM

## 2022-07-12 DIAGNOSIS — Z7189 Other specified counseling: Secondary | ICD-10-CM

## 2022-07-12 DIAGNOSIS — R002 Palpitations: Secondary | ICD-10-CM

## 2022-07-12 DIAGNOSIS — Z7984 Long term (current) use of oral hypoglycemic drugs: Secondary | ICD-10-CM

## 2022-07-12 DIAGNOSIS — Z8673 Personal history of transient ischemic attack (TIA), and cerebral infarction without residual deficits: Secondary | ICD-10-CM

## 2022-07-12 DIAGNOSIS — N1831 Chronic kidney disease, stage 3a: Secondary | ICD-10-CM

## 2022-07-12 DIAGNOSIS — R0609 Other forms of dyspnea: Secondary | ICD-10-CM

## 2022-07-12 DIAGNOSIS — R079 Chest pain, unspecified: Secondary | ICD-10-CM

## 2022-07-12 DIAGNOSIS — I10 Essential (primary) hypertension: Secondary | ICD-10-CM | POA: Diagnosis not present

## 2022-07-12 DIAGNOSIS — R0989 Other specified symptoms and signs involving the circulatory and respiratory systems: Secondary | ICD-10-CM

## 2022-07-12 DIAGNOSIS — I2510 Atherosclerotic heart disease of native coronary artery without angina pectoris: Secondary | ICD-10-CM

## 2022-07-12 NOTE — Patient Instructions (Signed)
Medication Instructions:  Your physician recommends that you continue on your current medications as directed. Please refer to the Current Medication list given to you today.   *If you need a refill on your cardiac medications before your next appointment, please call your pharmacy*  Lab Work: BMET 1 WEEK PRIOR TO PET SCAN   If you have labs (blood work) drawn today and your tests are completely normal, you will receive your results only by: MyChart Message (if you have MyChart) OR A paper copy in the mail If you have any lab test that is abnormal or we need to change your treatment, we will call you to review the results.  Testing/Procedures: Your physician has requested that you have a carotid duplex. This test is an ultrasound of the carotid arteries in your neck. It looks at blood flow through these arteries that supply the brain with blood. Allow one hour for this exam. There are no restrictions or special instructions.  Your physician has requested that you have an ankle brachial index (ABI). During this test an ultrasound and blood pressure cuff are used to evaluate the arteries that supply the arms and legs with blood. Allow thirty minutes for this exam. There are no restrictions or special instructions.  Your physician has requested that you have a lower extremity arterial duplex. This test is an ultrasound of the arteries in the legs or arms. It looks at arterial blood flow in the legs and arms. Allow one hour for Lower and Upper Arterial scans. There are no restrictions or special instructions  CARDIAC PET SCAN  THE OFFICE WILL CALL YOU TO SCHEDULE ONCE YOUR INSURANCE HAS BEEN REVIEWED   Follow-Up: At [facility], you and your health needs are our priority.  As part of our continuing mission to provide you with exceptional heart care, we have created designated Provider Care Teams.  These Care Teams include your primary Cardiologist (physician) and Advanced Practice  Providers (APPs -  Physician Assistants and Nurse Practitioners) who all work together to provide you with the care you need, when you need it.  We recommend signing up for the patient portal called "MyChart".  Sign up information is provided on this After Visit Summary.  MyChart is used to connect with patients for Virtual Visits (Telemedicine).  Patients are able to view lab/test results, encounter notes, upcoming appointments, etc.  Non-urgent messages can be sent to your provider as well.   To learn more about what you can do with MyChart, go to [email or web address].    Your next appointment:   8 week(s)  Provider:   Jodelle Red, MD

## 2022-07-18 ENCOUNTER — Telehealth: Payer: Self-pay | Admitting: Pharmacist

## 2022-07-18 NOTE — Progress Notes (Signed)
Patient appearing on report for quality metrics.  Outreached patient to discuss medication management. Patient answered but was unavailable at time of call, states to try again tomorrow. Will re-attempt 07/19/22.  Lynnda Shields, PharmD, BCPS Clinical Pharmacist [facility]

## 2022-07-31 ENCOUNTER — Other Ambulatory Visit (HOSPITAL_BASED_OUTPATIENT_CLINIC_OR_DEPARTMENT_OTHER): Payer: Self-pay

## 2022-07-31 ENCOUNTER — Encounter (HOSPITAL_BASED_OUTPATIENT_CLINIC_OR_DEPARTMENT_OTHER): Payer: Self-pay | Admitting: *Deleted

## 2022-07-31 ENCOUNTER — Telehealth (HOSPITAL_BASED_OUTPATIENT_CLINIC_OR_DEPARTMENT_OTHER): Payer: Self-pay | Admitting: Cardiology

## 2022-07-31 NOTE — Telephone Encounter (Signed)
Inetta Fermo a Financial risk analyst for [employer] is calling wanting to clarify what medications the patient needs to hold for the PET scan tomorrow. She reports the patient called her wanting clarification and she will call him back with the information. Please advise.

## 2022-07-31 NOTE — Telephone Encounter (Addendum)
Spoke with Vilma Meckel RN  No Imdur tomorrow or Wednesday No Metformin Wednesday  Ok to take Atenolol   Advised patient, verbalized understanding   Patient stated he is claustrophobic and would like Dr Cristal Deer to phone something  Will forward to Dr Cristal Deer for review

## 2022-08-01 ENCOUNTER — Telehealth (HOSPITAL_COMMUNITY): Payer: Self-pay | Admitting: Emergency Medicine

## 2022-08-01 ENCOUNTER — Encounter (HOSPITAL_BASED_OUTPATIENT_CLINIC_OR_DEPARTMENT_OTHER): Payer: Self-pay

## 2022-08-01 NOTE — Telephone Encounter (Signed)
Reaching out to patient to offer assistance regarding upcoming cardiac imaging study; pt verbalizes understanding of appt date/time, parking situation and where to check in, pre-test NPO status and medications ordered, and verified current allergies; name and call back number provided for further questions should they arise Ballard Budney RN Navigator Cardiac Imaging [place] [facility] [phone] office [phone] cell 

## 2022-08-02 ENCOUNTER — Ambulatory Visit (HOSPITAL_COMMUNITY)
Admission: RE | Admit: 2022-08-02 | Discharge: 2022-08-02 | Disposition: A | Discharge status: Home / Self Care | Payer: Medicare Other | Source: Ambulatory Visit | Attending: Cardiology | Admitting: Cardiology

## 2022-08-02 DIAGNOSIS — R079 Chest pain, unspecified: Secondary | ICD-10-CM | POA: Insufficient documentation

## 2022-08-02 DIAGNOSIS — I739 Peripheral vascular disease, unspecified: Secondary | ICD-10-CM | POA: Diagnosis not present

## 2022-08-02 DIAGNOSIS — R0609 Other forms of dyspnea: Secondary | ICD-10-CM | POA: Diagnosis not present

## 2022-08-02 DIAGNOSIS — R002 Palpitations: Secondary | ICD-10-CM | POA: Insufficient documentation

## 2022-08-02 DIAGNOSIS — I2510 Atherosclerotic heart disease of native coronary artery without angina pectoris: Secondary | ICD-10-CM | POA: Insufficient documentation

## 2022-08-02 DIAGNOSIS — I10 Essential (primary) hypertension: Secondary | ICD-10-CM | POA: Insufficient documentation

## 2022-08-02 LAB — NM PET CT CARDIAC PERFUSION MULTI W/ABSOLUTE BLOODFLOW
MBFR: 1.73
Nuc Rest EF: 57 %
Nuc Stress EF: 66 %
Rest MBF: 0.94 ml/g/min
ST Depression (mm): 0 mm
Stress MBF: 1.63 ml/g/min
TID: 1.02

## 2022-08-02 MED ORDER — REGADENOSON 0.4 MG/5ML IV SOLN
0.4000 mg | Freq: Once | INTRAVENOUS | Status: AC
  Administered 2022-08-02: 0.4 mg via INTRAVENOUS

## 2022-08-02 MED ORDER — RUBIDIUM RB82 GENERATOR (RUBYFILL)
23.5000 | PACK | Freq: Once | INTRAVENOUS | Status: AC
  Administered 2022-08-02: 23.5 via INTRAVENOUS

## 2022-08-02 MED ORDER — REGADENOSON 0.4 MG/5ML IV SOLN
INTRAVENOUS | Status: AC
  Filled 2022-08-02: qty 5

## 2022-08-03 ENCOUNTER — Telehealth: Payer: Self-pay | Admitting: Cardiology

## 2022-08-03 NOTE — Telephone Encounter (Signed)
Pt called in stating he was supposed to get his labs drawn before his PET scan however he forgot. He has already had pet scan done, he wants to know if he should still get his labs drawn. Please advise.

## 2022-08-03 NOTE — Telephone Encounter (Signed)
Left detailed message no need for labs, ok per [facility]

## 2022-08-06 ENCOUNTER — Other Ambulatory Visit (HOSPITAL_BASED_OUTPATIENT_CLINIC_OR_DEPARTMENT_OTHER): Payer: Self-pay | Admitting: Family Medicine

## 2022-08-07 ENCOUNTER — Other Ambulatory Visit: Payer: Self-pay

## 2022-08-07 ENCOUNTER — Other Ambulatory Visit (HOSPITAL_BASED_OUTPATIENT_CLINIC_OR_DEPARTMENT_OTHER): Payer: Self-pay

## 2022-08-07 MED ORDER — ROSUVASTATIN CALCIUM 20 MG PO TABS
20.0000 mg | ORAL_TABLET | Freq: Every day | ORAL | 1 refills | Status: DC
  Filled 2022-08-07: qty 90, 90d supply, fill #0
  Filled 2022-11-05: qty 90, 90d supply, fill #1

## 2022-08-07 MED ORDER — FAMOTIDINE 20 MG PO TABS
40.0000 mg | ORAL_TABLET | Freq: Every day | ORAL | 0 refills | Status: DC
  Filled 2022-08-07: qty 180, 90d supply, fill #0

## 2022-08-07 MED ORDER — ISOSORBIDE MONONITRATE ER 60 MG PO TB24
60.0000 mg | ORAL_TABLET | Freq: Every day | ORAL | 1 refills | Status: DC
  Filled 2022-08-07: qty 90, 90d supply, fill #0
  Filled 2022-11-05: qty 90, 90d supply, fill #1

## 2022-08-08 ENCOUNTER — Encounter (HOSPITAL_BASED_OUTPATIENT_CLINIC_OR_DEPARTMENT_OTHER): Payer: Medicare Other

## 2022-08-11 ENCOUNTER — Encounter (HOSPITAL_BASED_OUTPATIENT_CLINIC_OR_DEPARTMENT_OTHER): Payer: Medicare Other

## 2022-08-14 ENCOUNTER — Ambulatory Visit (HOSPITAL_BASED_OUTPATIENT_CLINIC_OR_DEPARTMENT_OTHER): Payer: Medicare Other | Admitting: Family Medicine

## 2022-08-15 ENCOUNTER — Encounter (HOSPITAL_BASED_OUTPATIENT_CLINIC_OR_DEPARTMENT_OTHER): Payer: Medicare Other

## 2022-08-16 ENCOUNTER — Ambulatory Visit (INDEPENDENT_AMBULATORY_CARE_PROVIDER_SITE_OTHER): Payer: Medicare Other

## 2022-08-16 DIAGNOSIS — R0989 Other specified symptoms and signs involving the circulatory and respiratory systems: Secondary | ICD-10-CM | POA: Diagnosis not present

## 2022-08-16 DIAGNOSIS — I739 Peripheral vascular disease, unspecified: Secondary | ICD-10-CM

## 2022-08-17 LAB — VAS US ABI WITH/WO TBI
Left ABI: 0.73
Right ABI: 0.58

## 2022-08-18 ENCOUNTER — Telehealth (HOSPITAL_BASED_OUTPATIENT_CLINIC_OR_DEPARTMENT_OTHER): Payer: Self-pay

## 2022-08-18 DIAGNOSIS — R0989 Other specified symptoms and signs involving the circulatory and respiratory systems: Secondary | ICD-10-CM

## 2022-08-18 DIAGNOSIS — R079 Chest pain, unspecified: Secondary | ICD-10-CM

## 2022-08-18 DIAGNOSIS — I10 Essential (primary) hypertension: Secondary | ICD-10-CM

## 2022-08-18 NOTE — Telephone Encounter (Addendum)
Left message for patient to call back     ----- Message from Alver Sorrow, NP sent at 08/17/2022  5:14 PM EDT ----- Noted tightness in his legs with ambulation during most recent clinic visit concerning for claudication.  ABIs bilaterally with moderate lower extremity arterial disease.  Recommend referral to peripheral vascular (Dr. Kirke Corin or Dr. Allyson Sabal) for further workup of PAD.  Alver Sorrow, NP  P Cv Div Dwb Triage Carotid duplex with mild bilateral stenosis.  Recommend repeat duplex in 1 year for monitoring.  Continue rosuvastatin to prevent progression.

## 2022-08-22 ENCOUNTER — Other Ambulatory Visit (HOSPITAL_BASED_OUTPATIENT_CLINIC_OR_DEPARTMENT_OTHER): Payer: Self-pay | Admitting: Family Medicine

## 2022-08-22 ENCOUNTER — Other Ambulatory Visit (HOSPITAL_BASED_OUTPATIENT_CLINIC_OR_DEPARTMENT_OTHER): Payer: Self-pay

## 2022-08-22 MED ORDER — NIFEDIPINE ER OSMOTIC RELEASE 60 MG PO TB24
60.0000 mg | ORAL_TABLET | Freq: Two times a day (BID) | ORAL | 0 refills | Status: DC
  Filled 2022-08-22: qty 180, 90d supply, fill #0

## 2022-08-22 NOTE — Telephone Encounter (Signed)
2nd call attempt, no answer, left message for patient to call back        ----- Message from Alver Sorrow, NP sent at 08/17/2022  5:14 PM EDT ----- Noted tightness in his legs with ambulation during most recent clinic visit concerning for claudication.  ABIs bilaterally with moderate lower extremity arterial disease.  Recommend referral to peripheral vascular (Dr. Kirke Corin or Dr. Allyson Sabal) for further workup of PAD.   Alver Sorrow, NP  P Cv Div Dwb Triage Carotid duplex with mild bilateral stenosis.  Recommend repeat duplex in 1 year for monitoring.  Continue rosuvastatin to prevent progression.

## 2022-08-23 ENCOUNTER — Encounter (HOSPITAL_BASED_OUTPATIENT_CLINIC_OR_DEPARTMENT_OTHER): Payer: Self-pay

## 2022-08-23 NOTE — Telephone Encounter (Signed)
3rd call attempt to patient, no answer, left message to call back, will mail results to patient asking him to call. Ref placed and repeat testing ordered for one year.     "----- Message from Alver Sorrow, NP sent at 08/17/2022  5:14 PM EDT ----- Noted tightness in his legs with ambulation during most recent clinic visit concerning for claudication.  ABIs bilaterally with moderate lower extremity arterial disease.  Recommend referral to peripheral vascular (Dr. Kirke Corin or Dr. Allyson Sabal) for further workup of PAD.   Alver Sorrow, NP  P Cv Div Dwb Triage Carotid duplex with mild bilateral stenosis.  Recommend repeat duplex in 1 year for monitoring.  Continue rosuvastatin to prevent progression."

## 2022-08-23 NOTE — Addendum Note (Signed)
Addended by: Marlene Lard on: 08/23/2022 08:45 AM   Modules accepted: Orders

## 2022-08-24 ENCOUNTER — Encounter (HOSPITAL_BASED_OUTPATIENT_CLINIC_OR_DEPARTMENT_OTHER): Payer: Self-pay | Admitting: Family Medicine

## 2022-08-24 ENCOUNTER — Ambulatory Visit (INDEPENDENT_AMBULATORY_CARE_PROVIDER_SITE_OTHER): Payer: Medicare Other | Admitting: Family Medicine

## 2022-08-24 VITALS — BP 143/59 | HR 60 | Ht 70.0 in | Wt 199.0 lb

## 2022-08-24 DIAGNOSIS — E1122 Type 2 diabetes mellitus with diabetic chronic kidney disease: Secondary | ICD-10-CM | POA: Diagnosis not present

## 2022-08-24 DIAGNOSIS — R109 Unspecified abdominal pain: Secondary | ICD-10-CM

## 2022-08-24 DIAGNOSIS — Z7984 Long term (current) use of oral hypoglycemic drugs: Secondary | ICD-10-CM

## 2022-08-24 DIAGNOSIS — K5900 Constipation, unspecified: Secondary | ICD-10-CM | POA: Diagnosis not present

## 2022-08-24 DIAGNOSIS — N1831 Chronic kidney disease, stage 3a: Secondary | ICD-10-CM

## 2022-08-24 DIAGNOSIS — I10 Essential (primary) hypertension: Secondary | ICD-10-CM | POA: Diagnosis not present

## 2022-08-24 NOTE — Assessment & Plan Note (Signed)
Recent hemoglobin A1c at goal at 6.4%.  Patient continues with metformin, denies any issues with medication.  He also continues with lifestyle modification.  Does have underlying stage IIIa CKD, proteinuria.  Currently taking losartan.  We did discuss potential role for SGLT2 inhibitor, discussed potential benefits related to kidney and heart protection, potential risks associated with medication.  For now, patient will consider potential addition of this class of medication We will plan to recheck hemoglobin A1c around time of next office visit given good control recently

## 2022-08-24 NOTE — Assessment & Plan Note (Signed)
Recent issues with occasional constipation and some associated left-sided abdominal discomfort.  He reports being up-to-date with colonoscopy with last colon cancer screening about 45 years ago when living in [place] with recommendation for 10-year follow-up.  We discussed options, given ongoing symptoms, lack of improvement with conservative measures, we will proceed with referral to GI for further evaluation of intermittent constipation with associated left-sided abdominal pain

## 2022-08-24 NOTE — Assessment & Plan Note (Signed)
Blood pressure borderline in office, systolic slightly above goal, diastolic at goal.  Patient continues with atenolol, Imdur, losartan, nifedipine.  Denies any issues with medications this time.  Does check blood pressure at home, readings similar to that in office today No changes to medication regimen at this time, recommend continued follow-up with cardiology, we will follow-up in about 3 to 4 months.  Recommend DASH diet, intermittent monitoring of blood pressure at home

## 2022-08-24 NOTE — Progress Notes (Signed)
    Procedures performed today:    None.  Independent interpretation of notes and tests performed by another provider:   None.  Brief History, Exam, Impression, and Recommendations:    BP (!) 143/59 (BP Location: Left Arm, Patient Position: Sitting, Cuff Size: Normal)   Pulse 60   Ht 5\' 10"  (1.778 m)   Wt 199 lb (90.3 kg)   SpO2 100%   BMI 28.55 kg/m   Hypertension Blood pressure borderline in office, systolic slightly above goal, diastolic at goal.  Patient continues with atenolol, Imdur, losartan, nifedipine.  Denies any issues with medications this time.  Does check blood pressure at home, readings similar to that in office today No changes to medication regimen at this time, recommend continued follow-up with cardiology, we will follow-up in about 3 to 4 months.  Recommend DASH diet, intermittent monitoring of blood pressure at home  Diabetes mellitus with chronic kidney disease (HCC) Recent hemoglobin A1c at goal at 6.4%.  Patient continues with metformin, denies any issues with medication.  He also continues with lifestyle modification.  Does have underlying stage IIIa CKD, proteinuria.  Currently taking losartan.  We did discuss potential role for SGLT2 inhibitor, discussed potential benefits related to kidney and heart protection, potential risks associated with medication.  For now, patient will consider potential addition of this class of medication We will plan to recheck hemoglobin A1c around time of next office visit given good control recently  Constipation Recent issues with occasional constipation and some associated left-sided abdominal discomfort.  He reports being up-to-date with colonoscopy with last colon cancer screening about 45 years ago when living in [place] with recommendation for 10-year follow-up.  We discussed options, given ongoing symptoms, lack of improvement with conservative measures, we will proceed with referral to GI for further evaluation of  intermittent constipation with associated left-sided abdominal pain  Return in about 3 months (around 11/24/2022) for DM, HTN.  Spent 34 minutes on this patient encounter, including preparation, chart review, face-to-face counseling with patient and coordination of care, and documentation of encounter   ___________________________________________ Frederick Leckey de Peru, MD, ABFM, [facility] Sports Medicine [facility]

## 2022-09-05 ENCOUNTER — Encounter (HOSPITAL_BASED_OUTPATIENT_CLINIC_OR_DEPARTMENT_OTHER): Payer: Self-pay | Admitting: Cardiology

## 2022-09-05 ENCOUNTER — Ambulatory Visit (INDEPENDENT_AMBULATORY_CARE_PROVIDER_SITE_OTHER): Payer: Medicare Other | Admitting: Cardiology

## 2022-09-05 VITALS — BP 149/68 | HR 59 | Ht 70.0 in | Wt 199.7 lb

## 2022-09-05 DIAGNOSIS — E1122 Type 2 diabetes mellitus with diabetic chronic kidney disease: Secondary | ICD-10-CM

## 2022-09-05 DIAGNOSIS — I2510 Atherosclerotic heart disease of native coronary artery without angina pectoris: Secondary | ICD-10-CM

## 2022-09-05 DIAGNOSIS — Z712 Person consulting for explanation of examination or test findings: Secondary | ICD-10-CM | POA: Diagnosis not present

## 2022-09-05 DIAGNOSIS — Z7984 Long term (current) use of oral hypoglycemic drugs: Secondary | ICD-10-CM

## 2022-09-05 DIAGNOSIS — N1831 Chronic kidney disease, stage 3a: Secondary | ICD-10-CM

## 2022-09-05 DIAGNOSIS — I739 Peripheral vascular disease, unspecified: Secondary | ICD-10-CM

## 2022-09-05 DIAGNOSIS — I6523 Occlusion and stenosis of bilateral carotid arteries: Secondary | ICD-10-CM | POA: Diagnosis not present

## 2022-09-05 DIAGNOSIS — Z8673 Personal history of transient ischemic attack (TIA), and cerebral infarction without residual deficits: Secondary | ICD-10-CM

## 2022-09-05 DIAGNOSIS — I10 Essential (primary) hypertension: Secondary | ICD-10-CM

## 2022-09-05 NOTE — Progress Notes (Signed)
Cardiology Office Note:  .   Date:  09/05/2022  ID:  Frederick Tyler, DOB 1951-02-23, MRN [id number] PCP: de [place], Raymond J, MD  [facility] Providers Cardiologist:  Jodelle Red, MD {  History of Present Illness: Frederick Tyler is a 72 y.o. male with PMH hypertension, TIA, CAD (denies PCI, 2 diagnostic caths), PAD, palpitations, type II diabetes with stage 3a CKD.   Today: Continues to have chest pressure, mostly in the morning. Lasts about a minute. Palpitations have improved.  Continues to walk at least 2 miles/day. Has fatigue in the afternoon, after he eats. Not limited in activity.  Reviewed extensive testing today.  PET scan: no ischemia or infarct. May have decreased myocardial blood flow reserve Carotids: mild bilateral stenosis 1-39%. ABI: moderate bilateral PAD, referred to Dr. Dwyane Dee arterial: 50-74% at distal CFA, mid SFA, and distal SFA on the right. Total occlusion in SFA, possible collateral seen in mid distal SFA.  ROS: Denies shortness of breath at rest or with normal exertion. No PND, orthopnea, LE edema or unexpected weight gain. No syncope. ROS otherwise negative except as noted.   Studies Reviewed: Marland Kitchen    EKG:  not ordered today  Physical Exam:   VS:  BP (!) 149/68 (BP Location: Right Arm, Patient Position: Sitting, Cuff Size: Normal)   Pulse (!) 59   Ht 5\' 10"  (1.778 m)   Wt 199 lb 11.2 oz (90.6 kg)   SpO2 96%   BMI 28.65 kg/m    Wt Readings from Last 3 Encounters:  09/05/22 199 lb 11.2 oz (90.6 kg)  08/24/22 199 lb (90.3 kg)  07/12/22 199 lb 14.4 oz (90.7 kg)    GEN: Well nourished, well developed in no acute distress HEENT: Normal, moist mucous membranes NECK: No JVD CARDIAC: regular rhythm, normal S1 and S2, no rubs or gallops. 1/6 systolic murmur. VASCULAR: Radial pulses 2+ bilaterally. Weak DP/PT pulses bilaterally. Bilateral soft carotid bruits, left louder than right RESPIRATORY:  Clear to auscultation without rales, wheezing  or rhonchi  ABDOMEN: Soft, non-tender, non-distended MUSCULOSKELETAL:  Ambulates independently SKIN: Warm and dry, no edema NEUROLOGIC:  Alert and oriented x 3. No focal neuro deficits noted. PSYCHIATRIC:  Normal affect    ASSESSMENT AND PLAN: .   CAD Chest pain Shortness of breath on exertion PAD with claudication History of TIA-->cerebellar CVA based on records Bilateral carotid bruits -has been on rivaroxaban 15 mg after TIA (?CVA) from neurology. No clear history of afib. Requesting records. Consider loop if unclear -on aspirin as well -reviewed cardiac PET, ABI, carotids, LE arterial today -has pending appt with Dr. Allyson Sabal to discuss PAD further given ABI, arterial studies -walks extensively to keep PAD managed. No rest pain or nonhealing wounds. -chest pain now improved. PET without ischemia, with significant coronary calcifications -carotids with bilateral mild stenosis -continue aspirin 81 mg daily (though caution given Xarelto as above) -continue rosuvastatin 20 mg daily. LDL 34 -continue beta blocker, imdur -check lp(a) if not done previously at [facility] -reviewed red flag warning signs that need immediate medical attention UPDATED TO ADD: able to get released [facility] records in Care Everywhere after visit.   Summary: Echo 06/06/19: EF 60-65%, no WMA. Normal RV. No significant valve disease. Normal atrial sizes. Normal aortic root  30 day event monitor 01/19/2017: no afib detected  Nuclear stress 10/16/2016: no ischemia  Cath 10/21/2012:  Dominance: Right LM: OK LAD: mid 50-60% LCX: nonobstructive RCA: moderate mid vessel disease with focal 70% PDA  LVEDP: 14 mmHg    Hypertension -continue imdur, losartan, nifedipine, atenolol  Palpitations -improved, continue to monitor -continue beta blocker  Type II diabetes with stage 3a CKD -would typically consider SGLT2i, though given PAD would avoid canagliflozin -current on metformin -re-address at follow up  Dispo:  3 months  Total time of encounter: 43 minutes total time of encounter, including 28 minutes spent in face-to-face patient care. This time includes coordination of care and counseling regarding above conditions. Remainder of non-face-to-face time involved reviewing chart documents/testing relevant to the patient encounter and documentation in the medical record.  Jodelle Red, MD, PhD, [facility]  [facility]    Signed, Jodelle Red, MD   Jodelle Red, MD, PhD, [facility] [place]  [facility]    Heart & Vascular at [facility] at [facility] [address] [place], [place] [address] [phone]

## 2022-09-05 NOTE — Patient Instructions (Signed)
Medication Instructions:  Your physician recommends that you continue on your current medications as directed. Please refer to the Current Medication list given to you today.   *If you need a refill on your cardiac medications before your next appointment, please call your pharmacy*  Lab Work: NONE   Testing/Procedures: NONE   Follow-Up: At [facility], you and your health needs are our priority.  As part of our continuing mission to provide you with exceptional heart care, we have created designated Provider Care Teams.  These Care Teams include your primary Cardiologist (physician) and Advanced Practice Providers (APPs -  Physician Assistants and Nurse Practitioners) who all work together to provide you with the care you need, when you need it.  We recommend signing up for the patient portal called "MyChart".  Sign up information is provided on this After Visit Summary.  MyChart is used to connect with patients for Virtual Visits (Telemedicine).  Patients are able to view lab/test results, encounter notes, upcoming appointments, etc.  Non-urgent messages can be sent to your provider as well.   To learn more about what you can do with MyChart, go to [email or web address].    Your next appointment:   3 month(s)  The format for your next appointment:   In Person  Provider:   Bridgette Christopher, MD              

## 2022-09-07 DIAGNOSIS — K080 Exfoliation of teeth due to systemic causes: Secondary | ICD-10-CM | POA: Diagnosis not present

## 2022-09-08 ENCOUNTER — Other Ambulatory Visit (HOSPITAL_COMMUNITY): Payer: Self-pay

## 2022-09-14 ENCOUNTER — Other Ambulatory Visit (HOSPITAL_BASED_OUTPATIENT_CLINIC_OR_DEPARTMENT_OTHER): Payer: Self-pay

## 2022-09-15 ENCOUNTER — Encounter: Payer: Self-pay | Admitting: Cardiovascular Disease

## 2022-09-15 ENCOUNTER — Ambulatory Visit: Payer: Medicare Other | Attending: Cardiovascular Disease | Admitting: Cardiovascular Disease

## 2022-09-15 VITALS — BP 130/68 | HR 61 | Ht 70.5 in | Wt 198.0 lb

## 2022-09-15 DIAGNOSIS — I739 Peripheral vascular disease, unspecified: Secondary | ICD-10-CM | POA: Diagnosis not present

## 2022-09-15 NOTE — Progress Notes (Signed)
09/15/2022 [name]   24-Dec-1950  [id number]  Primary Physician de Peru, Raymond J, MD Primary Cardiologist: Runell Gess MD Nicholes Calamity, [name]  HPI:  Frederick Tyler is a 72 y.o. mildly overweight married male father of 2, grandfather 2 grandchildren referred by Dr. Cristal Deer, his cardiologist, for peripheral arterial disease evaluation.  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.  He relocated from [facility] to [place] 2 years ago to be closer to family.  His risk factors include remote tobacco abuse, treated hypertension, diabetes and hyperlipidemia.  He has a strong family history of heart disease with both parents and 2 brothers all of whom have had an ischemic heart disease.  He has had 2 TIAs in the past currently on Xarelto.  He has a cardiac catheterization but no stents in the past.  He has CKD with creatinines that run in the mid 1 range.  He is noticed claudication for the last 7 to 8 years, which is symmetric but not necessarily lifestyle limiting.  He had Doppler studies performed 08/16/2022 revealing a right ABI of 0.58 and a left of 0.73.  He did have moderate disease in his right SFA as well as tibial vessels and an occluded left SFA.   Current Meds  Medication Sig   albuterol (VENTOLIN HFA) 108 (90 Base) MCG/ACT inhaler Inhale 1-2 puffs into the lungs every 6 (six) hours as needed for wheezing or shortness of breath.   aspirin 81 MG chewable tablet Chew 1 tablet (81 mg total) by mouth daily.   atenolol (TENORMIN) 100 MG tablet Take 1 tablet (100 mg total) by mouth 2 (two) times daily.   famotidine (PEPCID) 20 MG tablet Take 2 tablets (40 mg total) by mouth at bedtime.   isosorbide mononitrate (IMDUR) 60 MG 24 hr tablet Take 1 tablet (60 mg total) by mouth daily.   losartan (COZAAR) 50 MG tablet Take 1 tablet (50 mg total) by mouth in the morning and at bedtime.   metFORMIN (GLUCOPHAGE) 500  MG tablet Take 1 tablet (500 mg total) by mouth 2 (two) times daily with a meal.   NIFEdipine (PROCARDIA XL/NIFEDICAL XL) 60 MG 24 hr tablet Take 1 tablet (60 mg total) by mouth in the morning and at bedtime.   nitroGLYCERIN (NITROSTAT) 0.4 MG SL tablet Place 0.4 mg under the tongue every 5 (five) minutes as needed for chest pain (max dose of 3. At initiation of 3rd dose call 911.).   Probiotic Product (PROBIOTIC BLEND PO) Take by mouth. NuZymes   Rivaroxaban (XARELTO) 15 MG TABS tablet Take 1 tablet (15 mg total) by mouth daily with supper.   rosuvastatin (CRESTOR) 20 MG tablet Take 1 tablet (20 mg total) by mouth daily.     Allergies  Allergen Reactions   Grass Pollen(K-O-R-T-Swt Vern) Cough, Itching and Shortness Of Breath   Lipitor [Atorvastatin]     Liver issues     Social History   Socioeconomic History   Marital status: Married    Spouse name: Not on file   Number of children: Not on file   Years of education: Not on file   Highest education level: Bachelor's degree (e.g., BA, AB, BS)  Occupational History   Not on file  Tobacco Use   Smoking status: Former    Types: Cigarettes   Smokeless tobacco: Never  Vaping Use   Vaping Use: Never used  Substance and Sexual  Activity   Alcohol use: Never   Drug use: Never   Sexual activity: Yes    Birth control/protection: None  Other Topics Concern   Not on file  Social History Narrative   Not on file   Social Determinants of Health   Financial Resource Strain: Low Risk  (08/21/2022)   Overall Financial Resource Strain (CARDIA)    Difficulty of Paying Living Expenses: Not hard at all  Food Insecurity: No Food Insecurity (08/21/2022)   Hunger Vital Sign    Worried About Running Out of Food in the Last Year: Never true    Ran Out of Food in the Last Year: Never true  Transportation Needs: No Transportation Needs (08/21/2022)   PRAPARE - Administrator, Civil Service (Medical): No    Lack of Transportation  (Non-Medical): No  Physical Activity: Sufficiently Active (08/21/2022)   Exercise Vital Sign    Days of Exercise per Week: 6 days    Minutes of Exercise per Session: 60 min  Stress: No Stress Concern Present (08/21/2022)   [employer] of Occupational Health - Occupational Stress Questionnaire    Feeling of Stress : Not at all  Social Connections: Unknown (08/21/2022)   Social Connection and Isolation Panel [NHANES]    Frequency of Communication with Friends and Family: Twice a week    Frequency of Social Gatherings with Friends and Family: Once a week    Attends Religious Services: Patient declined    Database administrator or Organizations: Yes    Attends Banker Meetings: Patient declined    Marital Status: Married  Catering manager Violence: Not At Risk (08/12/2021)   Humiliation, Afraid, Rape, and Kick questionnaire    Fear of Current or Ex-Partner: No    Emotionally Abused: No    Physically Abused: No    Sexually Abused: No     Review of Systems: General: negative for chills, fever, night sweats or weight changes.  Cardiovascular: negative for chest pain, dyspnea on exertion, edema, orthopnea, palpitations, paroxysmal nocturnal dyspnea or shortness of breath Dermatological: negative for rash Respiratory: negative for cough or wheezing Urologic: negative for hematuria Abdominal: negative for nausea, vomiting, diarrhea, bright red blood per rectum, melena, or hematemesis Neurologic: negative for visual changes, syncope, or dizziness All other systems reviewed and are otherwise negative except as noted above.    Blood pressure 130/68, pulse 61, height 5' 10.5" (1.791 m), weight 198 lb (89.8 kg), SpO2 96 %.  General appearance: alert and no distress Neck: no adenopathy, no carotid bruit, no JVD, supple, symmetrical, trachea midline, and thyroid not enlarged, symmetric, no tenderness/mass/nodules Lungs: clear to auscultation bilaterally Heart: regular rate and  rhythm, S1, S2 normal, no murmur, click, rub or gallop Extremities: extremities normal, atraumatic, no cyanosis or edema Pulses: Decreased pedal pulses Skin: Skin color, texture, turgor normal. No rashes or lesions Neurologic: Grossly normal  EKG not performed today      ASSESSMENT AND PLAN:   Peripheral arterial disease (HCC) Mr. Rabalais was referred to me by Dr. Cristal Deer for evaluation of PAD.  He has a history of CAD and multiple other risk factors for vascular disease including strong family history, treated hypertension, diabetes and hyperlipidemia.  He does complain of some claudication which is not lifestyle limiting.  There is no history of chronic limb ischemia.  He had Doppler studies performed 08/16/2022 revealing a right ABI of 0.58 and a left of 0.73.  He did have moderate disease in his right SFA  as well as tibial vessels and an occluded left SFA.  At this point, he prefers conservative care including exercise therapy.  We talked about Pletal versus intervention which she wishes to defer at this time.  I will see him back in 3 months for follow-up.     Runell Gess MD FACP,FACC,FAHA, [facility] 09/15/2022 3:28 PM

## 2022-09-15 NOTE — Assessment & Plan Note (Signed)
Frederick Tyler was referred to me by Dr. Cristal Deer for evaluation of PAD.  He has a history of CAD and multiple other risk factors for vascular disease including strong family history, treated hypertension, diabetes and hyperlipidemia.  He does complain of some claudication which is not lifestyle limiting.  There is no history of chronic limb ischemia.  He had Doppler studies performed 08/16/2022 revealing a right ABI of 0.58 and a left of 0.73.  He did have moderate disease in his right SFA as well as tibial vessels and an occluded left SFA.  At this point, he prefers conservative care including exercise therapy.  We talked about Pletal versus intervention which she wishes to defer at this time.  I will see him back in 3 months for follow-up.

## 2022-09-15 NOTE — Patient Instructions (Signed)
Medication Instructions:  Your physician recommends that you continue on your current medications as directed. Please refer to the Current Medication list given to you today.  *If you need a refill on your cardiac medications before your next appointment, please call your pharmacy*   Follow-Up: At [facility], you and your health needs are our priority.  As part of our continuing mission to provide you with exceptional heart care, we have created designated Provider Care Teams.  These Care Teams include your primary Cardiologist (physician) and Advanced Practice Providers (APPs -  Physician Assistants and Nurse Practitioners) who all work together to provide you with the care you need, when you need it.  We recommend signing up for the patient portal called "MyChart".  Sign up information is provided on this After Visit Summary.  MyChart is used to connect with patients for Virtual Visits (Telemedicine).  Patients are able to view lab/test results, encounter notes, upcoming appointments, etc.  Non-urgent messages can be sent to your provider as well.   To learn more about what you can do with MyChart, go to [email or web address].    Your next appointment:   3 month(s)  Provider:   Jonathan Berry, MD  

## 2022-09-29 ENCOUNTER — Other Ambulatory Visit (HOSPITAL_BASED_OUTPATIENT_CLINIC_OR_DEPARTMENT_OTHER): Payer: Self-pay

## 2022-09-29 ENCOUNTER — Other Ambulatory Visit (HOSPITAL_BASED_OUTPATIENT_CLINIC_OR_DEPARTMENT_OTHER): Payer: Self-pay | Admitting: Family Medicine

## 2022-09-29 MED ORDER — ATENOLOL 100 MG PO TABS
100.0000 mg | ORAL_TABLET | Freq: Two times a day (BID) | ORAL | 1 refills | Status: DC
  Filled 2022-09-29: qty 180, 90d supply, fill #0
  Filled 2022-12-30: qty 180, 90d supply, fill #1

## 2022-10-10 ENCOUNTER — Ambulatory Visit (INDEPENDENT_AMBULATORY_CARE_PROVIDER_SITE_OTHER): Payer: Medicare Other

## 2022-10-10 ENCOUNTER — Encounter (HOSPITAL_BASED_OUTPATIENT_CLINIC_OR_DEPARTMENT_OTHER): Payer: Self-pay

## 2022-10-10 VITALS — Ht 70.5 in | Wt 195.0 lb

## 2022-10-10 DIAGNOSIS — Z0000 Encounter for general adult medical examination without abnormal findings: Secondary | ICD-10-CM | POA: Diagnosis not present

## 2022-10-10 NOTE — Patient Instructions (Signed)
Frederick Tyler , Thank you for taking time to come for your Medicare Wellness Visit. I appreciate your ongoing commitment to your health goals. Please review the following plan we discussed and let me know if I can assist you in the future.   These are the goals we discussed:  Goals       Patient Stated (pt-stated)      Enjoy life and stay healthy        This is a list of the screening recommended for you and due dates:  Health Maintenance  Topic Date Due   Hepatitis C Screening  Never done   DTaP/Tdap/Td vaccine (1 - Tdap) Never done   Zoster (Shingles) Vaccine (1 of 2) 03/27/2000   Pneumonia Vaccine (1 of 1 - PCV) Never done   COVID-19 Vaccine (3 - 2023-24 season) 11/25/2021   Flu Shot  10/26/2022   Hemoglobin A1C  11/14/2022   Yearly kidney health urinalysis for diabetes  12/20/2022   Eye exam for diabetics  01/31/2023   Yearly kidney function blood test for diabetes  05/17/2023   Complete foot exam   05/17/2023   Medicare Annual Wellness Visit  10/10/2023   Colon Cancer Screening  04/05/2030   HPV Vaccine  Aged Out    Advanced directives: Advance directive discussed with you today. Even though you declined this today, please call our office should you change your mind, and we can give you the proper paperwork for you to fill out. Advance care planning is a way to make decisions about medical care that fits your values in case you are ever unable to make these decisions for yourself.  Information on Advanced Care Planning can be found at [facility] Advance Health Care Directives Advance Health Care Directives ([email or web address]    Conditions/risks identified:  You are due for the vaccines checked below. You may have these done at your preferred pharmacy. Please have them fax the office proof of the vaccines so that we can update your chart.   []  Flu (due annually) [x]  Shingrix (Shingles vaccine) [x]  Pneumonia Vaccines [x]  TDAP (Tetanus) Vaccine every 10  years [x]  Covid-19    Next appointment: VIRTUAL/ TELEPHONE VISIT Follow up in one year for your annual wellness visit  October 16, 2023 at 10:00 am telephone visit   Preventive Care 13 Years and Older, Male  Preventive care refers to lifestyle choices and visits with your health care provider that can promote health and wellness. What does preventive care include? A yearly physical exam. This is also called an annual well check. Dental exams once or twice a year. Routine eye exams. Ask your health care provider how often you should have your eyes checked. Personal lifestyle choices, including: Daily care of your teeth and gums. Regular physical activity. Eating a healthy diet. Avoiding tobacco and drug use. Limiting alcohol use. Practicing safe sex. Taking low doses of aspirin every day. Taking vitamin and mineral supplements as recommended by your health care provider. What happens during an annual well check? The services and screenings done by your health care provider during your annual well check will depend on your age, overall health, lifestyle risk factors, and family history of disease. Counseling  Your health care provider may ask you questions about your: Alcohol use. Tobacco use. Drug use. Emotional well-being. Home and relationship well-being. Sexual activity. Eating habits. History of falls. Memory and ability to understand (cognition). Work and work Astronomer. Screening  You may have the following tests  or measurements: Height, weight, and BMI. Blood pressure. Lipid and cholesterol levels. These may be checked every 5 years, or more frequently if you are over 54 years old. Skin check. Lung cancer screening. You may have this screening every year starting at age 50 if you have a 30-pack-year history of smoking and currently smoke or have quit within the past 15 years. Fecal occult blood test (FOBT) of the stool. You may have this test every year starting at  age 13. Flexible sigmoidoscopy or colonoscopy. You may have a sigmoidoscopy every 5 years or a colonoscopy every 10 years starting at age 64. Prostate cancer screening. Recommendations will vary depending on your family history and other risks. Hepatitis C blood test. Hepatitis B blood test. Sexually transmitted disease (STD) testing. Diabetes screening. This is done by checking your blood sugar (glucose) after you have not eaten for a while (fasting). You may have this done every 1-3 years. Abdominal aortic aneurysm (AAA) screening. You may need this if you are a current or former smoker. Osteoporosis. You may be screened starting at age 53 if you are at high risk. Talk with your health care provider about your test results, treatment options, and if necessary, the need for more tests. Vaccines  Your health care provider may recommend certain vaccines, such as: Influenza vaccine. This is recommended every year. Tetanus, diphtheria, and acellular pertussis (Tdap, Td) vaccine. You may need a Td booster every 10 years. Zoster vaccine. You may need this after age 29. Pneumococcal 13-valent conjugate (PCV13) vaccine. One dose is recommended after age 74. Pneumococcal polysaccharide (PPSV23) vaccine. One dose is recommended after age 51. Talk to your health care provider about which screenings and vaccines you need and how often you need them. This information is not intended to replace advice given to you by your health care provider. Make sure you discuss any questions you have with your health care provider. Document Released: 04/09/2015 Document Revised: 12/01/2015 Document Reviewed: 01/12/2015 Elsevier Interactive Patient Education  2017 [employer].  Fall Prevention in the Home Falls can cause injuries. They can happen to people of all ages. There are many things you can do to make your home safe and to help prevent falls. What can I do on the outside of my home? Regularly fix the edges  of walkways and driveways and fix any cracks. Remove anything that might make you trip as you walk through a door, such as a raised step or threshold. Trim any bushes or trees on the path to your home. Use bright outdoor lighting. Clear any walking paths of anything that might make someone trip, such as rocks or tools. Regularly check to see if handrails are loose or broken. Make sure that both sides of any steps have handrails. Any raised decks and porches should have guardrails on the edges. Have any leaves, snow, or ice cleared regularly. Use sand or salt on walking paths during winter. Clean up any spills in your garage right away. This includes oil or grease spills. What can I do in the bathroom? Use night lights. Install grab bars by the toilet and in the tub and shower. Do not use towel bars as grab bars. Use non-skid mats or decals in the tub or shower. If you need to sit down in the shower, use a plastic, non-slip stool. Keep the floor dry. Clean up any water that spills on the floor as soon as it happens. Remove soap buildup in the tub or shower regularly. Attach  bath mats securely with double-sided non-slip rug tape. Do not have throw rugs and other things on the floor that can make you trip. What can I do in the bedroom? Use night lights. Make sure that you have a light by your bed that is easy to reach. Do not use any sheets or blankets that are too big for your bed. They should not hang down onto the floor. Have a firm chair that has side arms. You can use this for support while you get dressed. Do not have throw rugs and other things on the floor that can make you trip. What can I do in the kitchen? Clean up any spills right away. Avoid walking on wet floors. Keep items that you use a lot in easy-to-reach places. If you need to reach something above you, use a strong step stool that has a grab bar. Keep electrical cords out of the way. Do not use floor polish or wax that  makes floors slippery. If you must use wax, use non-skid floor wax. Do not have throw rugs and other things on the floor that can make you trip. What can I do with my stairs? Do not leave any items on the stairs. Make sure that there are handrails on both sides of the stairs and use them. Fix handrails that are broken or loose. Make sure that handrails are as long as the stairways. Check any carpeting to make sure that it is firmly attached to the stairs. Fix any carpet that is loose or worn. Avoid having throw rugs at the top or bottom of the stairs. If you do have throw rugs, attach them to the floor with carpet tape. Make sure that you have a light switch at the top of the stairs and the bottom of the stairs. If you do not have them, ask someone to add them for you. What else can I do to help prevent falls? Wear shoes that: Do not have high heels. Have rubber bottoms. Are comfortable and fit you well. Are closed at the toe. Do not wear sandals. If you use a stepladder: Make sure that it is fully opened. Do not climb a closed stepladder. Make sure that both sides of the stepladder are locked into place. Ask someone to hold it for you, if possible. Clearly mark and make sure that you can see: Any grab bars or handrails. First and last steps. Where the edge of each step is. Use tools that help you move around (mobility aids) if they are needed. These include: Canes. Walkers. Scooters. Crutches. Turn on the lights when you go into a dark area. Replace any light bulbs as soon as they burn out. Set up your furniture so you have a clear path. Avoid moving your furniture around. If any of your floors are uneven, fix them. If there are any pets around you, be aware of where they are. Review your medicines with your doctor. Some medicines can make you feel dizzy. This can increase your chance of falling. Ask your doctor what other things that you can do to help prevent falls. This  information is not intended to replace advice given to you by your health care provider. Make sure you discuss any questions you have with your health care provider. Document Released: 01/07/2009 Document Revised: 08/19/2015 Document Reviewed: 04/17/2014 Elsevier Interactive Patient Education  2017 [employer].

## 2022-10-10 NOTE — Progress Notes (Signed)
Because this visit was a virtual/telehealth visit,  certain criteria was not obtained, such a blood pressure, CBG if patient is a diabetic, and timed up and go. Any medications not marked as "taking" was not mentioned during the medication reconciliation part of the visit. Any vitals not documented were not able to be obtained due to this being a telehealth visit. Vitals documented are verbally provided by the patient.   Subjective:   Frederick Tyler is a 72 y.o. male who presents for Medicare Annual/Subsequent preventive examination.  Visit Complete: Virtual  I connected with  Frederick Tyler on 10/10/22 by a audio enabled telemedicine application and verified that I am speaking with the correct person using two identifiers.  Patient Location: Home  Provider Location: Home Office  I discussed the limitations of evaluation and management by telemedicine. The patient expressed understanding and agreed to proceed.  Patient Medicare AWV questionnaire was completed by the patient on 10/07/22; I have confirmed that all information answered by patient is correct and no changes since this date.  Review of Systems     Cardiac Risk Factors include: advanced age (>76men, >20 women);dyslipidemia;hypertension;male gender     Objective:    Today's Vitals   10/10/22 1317  Weight: 195 lb (88.5 kg)  Height: 5' 10.5" (1.791 m)   Body mass index is 27.58 kg/m.     10/10/2022    1:24 PM 12/31/2020   12:43 PM 07/13/2020    1:36 PM  Advanced Directives  Does Patient Have a Medical Advance Directive? No Yes Yes  Type of Best boy of [place];Living will  Does patient want to make changes to medical advance directive?   No - Patient declined  Would patient like information on creating a medical advance directive? No - Patient declined      Current Medications (verified) Outpatient Encounter Medications as of 10/10/2022  Medication Sig   albuterol (VENTOLIN HFA) 108 (90 Base)  MCG/ACT inhaler Inhale 1-2 puffs into the lungs every 6 (six) hours as needed for wheezing or shortness of breath.   aspirin 81 MG chewable tablet Chew 1 tablet (81 mg total) by mouth daily.   atenolol (TENORMIN) 100 MG tablet Take 1 tablet (100 mg total) by mouth 2 (two) times daily.   famotidine (PEPCID) 20 MG tablet Take 2 tablets (40 mg total) by mouth at bedtime.   isosorbide mononitrate (IMDUR) 60 MG 24 hr tablet Take 1 tablet (60 mg total) by mouth daily.   losartan (COZAAR) 50 MG tablet Take 1 tablet (50 mg total) by mouth in the morning and at bedtime.   metFORMIN (GLUCOPHAGE) 500 MG tablet Take 1 tablet (500 mg total) by mouth 2 (two) times daily with a meal.   NIFEdipine (PROCARDIA XL/NIFEDICAL XL) 60 MG 24 hr tablet Take 1 tablet (60 mg total) by mouth in the morning and at bedtime.   nitroGLYCERIN (NITROSTAT) 0.4 MG SL tablet Place 0.4 mg under the tongue every 5 (five) minutes as needed for chest pain (max dose of 3. At initiation of 3rd dose call 911.).   Probiotic Product (PROBIOTIC BLEND PO) Take by mouth. NuZymes   Rivaroxaban (XARELTO) 15 MG TABS tablet Take 1 tablet (15 mg total) by mouth daily with supper.   rosuvastatin (CRESTOR) 20 MG tablet Take 1 tablet (20 mg total) by mouth daily.   No facility-administered encounter medications on file as of 10/10/2022.    Allergies (verified) Grass pollen([id number]-o-r-t-swt vern) and Lipitor [atorvastatin]   History:  Past Medical History:  Diagnosis Date   Chronic kidney disease    GERD (gastroesophageal reflux disease)    Hypertension    History reviewed. No pertinent surgical history. Family History  Family history unknown: Yes   Social History   Socioeconomic History   Marital status: Married    Spouse name: Not on file   Number of children: Not on file   Years of education: Not on file   Highest education level: Bachelor's degree (e.g., BA, AB, BS)  Occupational History   Not on file  Tobacco Use   Smoking status:  Former    Types: Cigarettes   Smokeless tobacco: Never  Vaping Use   Vaping status: Never Used  Substance and Sexual Activity   Alcohol use: Never   Drug use: Never   Sexual activity: Yes    Birth control/protection: None  Other Topics Concern   Not on file  Social History Narrative   Not on file   Social Determinants of Health   Financial Resource Strain: Low Risk  (10/10/2022)   Overall Financial Resource Strain (CARDIA)    Difficulty of Paying Living Expenses: Not hard at all  Food Insecurity: No Food Insecurity (10/10/2022)   Hunger Vital Sign    Worried About Running Out of Food in the Last Year: Never true    Ran Out of Food in the Last Year: Never true  Transportation Needs: No Transportation Needs (10/10/2022)   PRAPARE - Administrator, Civil Service (Medical): No    Lack of Transportation (Non-Medical): No  Physical Activity: Sufficiently Active (10/10/2022)   Exercise Vital Sign    Days of Exercise per Week: 7 days    Minutes of Exercise per Session: 60 min  Stress: No Stress Concern Present (10/10/2022)   [employer] of Occupational Health - Occupational Stress Questionnaire    Feeling of Stress : Not at all  Social Connections: Socially Integrated (10/10/2022)   Social Connection and Isolation Panel [NHANES]    Frequency of Communication with Friends and Family: More than three times a week    Frequency of Social Gatherings with Friends and Family: Three times a week    Attends Religious Services: More than 4 times per year    Active Member of Clubs or Organizations: Yes    Attends Engineer, structural: More than 4 times per year    Marital Status: Married    Tobacco Counseling Counseling given: Yes   Clinical Intake:  Pre-visit preparation completed: Yes  Pain : No/denies pain     BMI - recorded: 27.58 Nutritional Status: BMI 25 -29 Overweight Nutritional Risks: None Diabetes: No  How often do you need to have someone  help you when you read instructions, pamphlets, or other written materials from your doctor or pharmacy?: 1 - Never  Interpreter Needed?: No  Information entered by :: Abby Shahmeer Bunn,CMA   Activities of Daily Living    10/10/2022    1:21 PM 10/07/2022   12:20 PM  In your present state of health, do you have any difficulty performing the following activities:  Hearing? 0 0  Vision? 0 0  Difficulty concentrating or making decisions? 0 0  Walking or climbing stairs? 0 0  Dressing or bathing? 0 0  Doing errands, shopping? 0 0  Preparing Food and eating ? N N  Using the Toilet? N N  In the past six months, have you accidently leaked urine? N N  Do you have problems with loss  of bowel control? N N  Managing your Medications? N N  Managing your Finances? N N  Housekeeping or managing your Housekeeping? N N    Patient Care Team: [place], Buren Kos, MD as PCP - General (Family Medicine) Jodelle Red, MD as PCP - Cardiology (Cardiology)  Indicate any recent Medical Services you may have received from other than [facility] providers in the past year (date may be approximate).     Assessment:   This is a routine wellness examination for [place].  Hearing/Vision screen Hearing Screening - Comments:: Patient denies any hearing difficulties.    Dietary issues and exercise activities discussed:     Goals Addressed               This Visit's Progress     Patient Stated (pt-stated)        Enjoy life and stay healthy       Depression Screen    10/10/2022    1:22 PM 08/24/2022    1:15 PM 05/16/2022    3:27 PM 12/29/2021    3:01 PM 08/12/2021   11:43 AM 07/19/2021    1:55 PM 08/30/2020    2:20 PM  PHQ 2/9 Scores  PHQ - 2 Score 0 0 0 0 0 0 0  PHQ- 9 Score    0     Exception Documentation  Medical reason Medical reason Medical reason  Medical reason     Fall Risk    10/10/2022    1:26 PM 10/07/2022   12:20 PM 08/24/2022    1:14 PM 05/16/2022    3:27 PM 12/29/2021    3:01 PM   Fall Risk   Falls in the past year? 0 0 0 0 0  Number falls in past yr: 0 0 0 0 0  Injury with Fall? 0 0 0 0 0  Risk for fall due to : No Fall Risks  No Fall Risks No Fall Risks No Fall Risks  Follow up Falls prevention discussed  Falls evaluation completed Falls evaluation completed Falls evaluation completed    MEDICARE RISK AT HOME:  Medicare Risk at Home - 10/10/22 1327     Any stairs in or around the home? No    If so, are there any without handrails? Yes    Home free of loose throw rugs in walkways, pet beds, electrical cords, etc? Yes    Adequate lighting in your home to reduce risk of falls? Yes    Life alert? No    Use of a cane, walker or w/c? No    Grab bars in the bathroom? No    Shower chair or bench in shower? No             TIMED UP AND GO:  Was the test performed?  No    Cognitive Function:        10/10/2022    1:27 PM 08/12/2021   11:49 AM  6CIT Screen  What Year? 0 points 0 points  What month? 0 points 0 points  What time? 0 points 0 points  Count back from 20 0 points 0 points  Months in reverse 0 points 0 points  Repeat phrase 0 points 2 points  Total Score 0 points 2 points    Immunizations Immunization History  Administered Date(s) Administered   Influenza, Quadrivalent, Recombinant, Inj, Pf 12/29/2021    TDAP status: Due, Education has been provided regarding the importance of this vaccine. Advised may receive this vaccine at local  pharmacy or Health Dept. Aware to provide a copy of the vaccination record if obtained from local pharmacy or Health Dept. Verbalized acceptance and understanding.  Flu Vaccine status: Up to date  Pneumococcal vaccine status: Due, Education has been provided regarding the importance of this vaccine. Advised may receive this vaccine at local pharmacy or Health Dept. Aware to provide a copy of the vaccination record if obtained from local pharmacy or Health Dept. Verbalized acceptance and  understanding.  Covid-19 vaccine status: Information provided on how to obtain vaccines.   Qualifies for Shingles Vaccine? Yes   Zostavax completed No   Shingrix Completed?: No.    Education has been provided regarding the importance of this vaccine. Patient has been advised to call insurance company to determine out of pocket expense if they have not yet received this vaccine. Advised may also receive vaccine at local pharmacy or Health Dept. Verbalized acceptance and understanding.  Screening Tests Health Maintenance  Topic Date Due   Hepatitis C Screening  Never done   DTaP/Tdap/Td (1 - Tdap) Never done   Zoster Vaccines- Shingrix (1 of 2) 03/27/2000   Pneumonia Vaccine 5+ Years old (1 of 1 - PCV) Never done   COVID-19 Vaccine (3 - 2023-24 season) 11/25/2021   Medicare Annual Wellness (AWV)  08/13/2022   INFLUENZA VACCINE  10/26/2022   HEMOGLOBIN A1C  11/14/2022   Diabetic kidney evaluation - Urine ACR  12/20/2022   OPHTHALMOLOGY EXAM  01/31/2023   Diabetic kidney evaluation - eGFR measurement  05/17/2023   FOOT EXAM  05/17/2023   Colonoscopy  04/05/2030   HPV VACCINES  Aged Out    Health Maintenance  Health Maintenance Due  Topic Date Due   Hepatitis C Screening  Never done   DTaP/Tdap/Td (1 - Tdap) Never done   Zoster Vaccines- Shingrix (1 of 2) 03/27/2000   Pneumonia Vaccine 17+ Years old (1 of 1 - PCV) Never done   COVID-19 Vaccine (3 - 2023-24 season) 11/25/2021   Medicare Annual Wellness (AWV)  08/13/2022    Colorectal cancer screening: Type of screening: Colonoscopy. Completed 04/05/2020. Repeat every 10 years  Lung Cancer Screening: (Low Dose CT Chest recommended if Age 49-80 years, 20 pack-year currently smoking OR have quit w/in 15years.) does not qualify.   Additional Screening:  Hepatitis C Screening: does not qualify; declined  Vision Screening: Recommended annual ophthalmology exams for early detection of glaucoma and other disorders of the eye. Is the  patient up to date with their annual eye exam?  Yes  Who is the provider or what is the name of the office in which the patient attends annual eye exams? Patient doesn't remember doctors name If pt is not established with a provider, would they like to be referred to a provider to establish care? No .   Dental Screening: Recommended annual dental exams for proper oral hygiene  Diabetic Foot Exam: Diabetic Foot Exam: Completed 05/16/2022  Community Resource Referral / Chronic Care Management: CRR required this visit?  No   CCM required this visit?  No     Plan:     I have personally reviewed and noted the following in the patient's chart:   Medical and social history Use of alcohol, tobacco or illicit drugs  Current medications and supplements including opioid prescriptions. Patient is not currently taking opioid prescriptions. Functional ability and status Nutritional status Physical activity Advanced directives List of other physicians Hospitalizations, surgeries, and ER visits in previous 12 months Vitals Screenings to include  cognitive, depression, and falls Referrals and appointments  In addition, I have reviewed and discussed with patient certain preventive protocols, quality metrics, and best practice recommendations. A written personalized care plan for preventive services as well as general preventive health recommendations were provided to patient.     Jordan Hawks Laquetta Racey, CMA   10/10/2022   After Visit Summary: (MyChart) Due to this being a telephonic visit, the after visit summary with patients personalized plan was offered to patient via MyChart   Nurse Notes:

## 2022-10-12 ENCOUNTER — Encounter (HOSPITAL_BASED_OUTPATIENT_CLINIC_OR_DEPARTMENT_OTHER): Payer: Self-pay | Admitting: *Deleted

## 2022-10-25 ENCOUNTER — Telehealth: Payer: Self-pay | Admitting: Gastroenterology

## 2022-10-25 NOTE — Telephone Encounter (Signed)
Can schedule him in the office for a new patient visit. Thanks

## 2022-10-25 NOTE — Telephone Encounter (Signed)
Hi Dr. Adela Lank,   Supervising Provider: 10/25/2022-AM    We received a referral for patient to be evaluated for constipation and abdominal pain. The patient does have GI history in [place] from 2022, he had a colonoscopy and EGD in 2017. Records were obtained and scanned into Media for you to review and advise on scheduling.    Thank you

## 2022-10-29 DIAGNOSIS — N1831 Chronic kidney disease, stage 3a: Secondary | ICD-10-CM | POA: Diagnosis not present

## 2022-10-29 DIAGNOSIS — J309 Allergic rhinitis, unspecified: Secondary | ICD-10-CM | POA: Diagnosis not present

## 2022-11-02 NOTE — Telephone Encounter (Signed)
Called patient to schedule left voicemail. 

## 2022-11-05 ENCOUNTER — Other Ambulatory Visit (HOSPITAL_BASED_OUTPATIENT_CLINIC_OR_DEPARTMENT_OTHER): Payer: Self-pay | Admitting: Family Medicine

## 2022-11-06 ENCOUNTER — Other Ambulatory Visit (HOSPITAL_BASED_OUTPATIENT_CLINIC_OR_DEPARTMENT_OTHER): Payer: Self-pay

## 2022-11-06 MED ORDER — FAMOTIDINE 20 MG PO TABS
40.0000 mg | ORAL_TABLET | Freq: Every day | ORAL | 0 refills | Status: DC
  Filled 2022-11-06: qty 180, 90d supply, fill #0

## 2022-11-07 ENCOUNTER — Other Ambulatory Visit (HOSPITAL_BASED_OUTPATIENT_CLINIC_OR_DEPARTMENT_OTHER): Payer: Self-pay | Admitting: Family Medicine

## 2022-11-07 ENCOUNTER — Other Ambulatory Visit (HOSPITAL_BASED_OUTPATIENT_CLINIC_OR_DEPARTMENT_OTHER): Payer: Self-pay

## 2022-11-07 MED ORDER — NITROGLYCERIN 0.4 MG SL SUBL
0.4000 mg | SUBLINGUAL_TABLET | SUBLINGUAL | 1 refills | Status: AC | PRN
  Filled 2022-11-07: qty 25, 8d supply, fill #0

## 2022-11-19 ENCOUNTER — Other Ambulatory Visit (HOSPITAL_BASED_OUTPATIENT_CLINIC_OR_DEPARTMENT_OTHER): Payer: Self-pay | Admitting: Family Medicine

## 2022-11-19 DIAGNOSIS — I10 Essential (primary) hypertension: Secondary | ICD-10-CM

## 2022-11-20 ENCOUNTER — Other Ambulatory Visit (HOSPITAL_BASED_OUTPATIENT_CLINIC_OR_DEPARTMENT_OTHER): Payer: Self-pay

## 2022-11-20 ENCOUNTER — Other Ambulatory Visit: Payer: Self-pay

## 2022-11-20 MED ORDER — NIFEDIPINE ER OSMOTIC RELEASE 60 MG PO TB24
60.0000 mg | ORAL_TABLET | Freq: Two times a day (BID) | ORAL | 0 refills | Status: DC
  Filled 2022-11-20: qty 180, 90d supply, fill #0

## 2022-11-20 MED ORDER — LOSARTAN POTASSIUM 50 MG PO TABS
50.0000 mg | ORAL_TABLET | Freq: Two times a day (BID) | ORAL | 1 refills | Status: DC
Start: 2022-11-20 — End: 2023-05-17
  Filled 2022-11-20: qty 180, 90d supply, fill #0
  Filled 2023-02-15: qty 180, 90d supply, fill #1

## 2022-11-23 ENCOUNTER — Ambulatory Visit (HOSPITAL_BASED_OUTPATIENT_CLINIC_OR_DEPARTMENT_OTHER): Payer: Medicare Other | Admitting: Family Medicine

## 2022-11-23 ENCOUNTER — Encounter (HOSPITAL_BASED_OUTPATIENT_CLINIC_OR_DEPARTMENT_OTHER): Payer: Self-pay | Admitting: Family Medicine

## 2022-11-23 VITALS — BP 140/66 | HR 58 | Ht 70.5 in | Wt 199.3 lb

## 2022-11-23 DIAGNOSIS — Z23 Encounter for immunization: Secondary | ICD-10-CM

## 2022-11-23 DIAGNOSIS — I10 Essential (primary) hypertension: Secondary | ICD-10-CM

## 2022-11-23 DIAGNOSIS — E1122 Type 2 diabetes mellitus with diabetic chronic kidney disease: Secondary | ICD-10-CM | POA: Diagnosis not present

## 2022-11-23 DIAGNOSIS — Z7984 Long term (current) use of oral hypoglycemic drugs: Secondary | ICD-10-CM

## 2022-11-23 DIAGNOSIS — N1831 Chronic kidney disease, stage 3a: Secondary | ICD-10-CM

## 2022-11-23 DIAGNOSIS — Z8673 Personal history of transient ischemic attack (TIA), and cerebral infarction without residual deficits: Secondary | ICD-10-CM | POA: Insufficient documentation

## 2022-11-23 LAB — POCT GLYCOSYLATED HEMOGLOBIN (HGB A1C): Hemoglobin A1C: 6.7 % — AB (ref 4.0–5.6)

## 2022-11-23 NOTE — Assessment & Plan Note (Signed)
Recent hemoglobin A1c at goal at 6.4%.  Patient continues with metformin, denies any issues with medication.  He also continues with lifestyle modification.  Does have underlying stage IIIa CKD, proteinuria.  Currently taking losartan.  We did discuss potential role for SGLT2 inhibitor, discussed potential benefits related to kidney and heart protection, potential risks associated with medication.  For now, patient will consider potential addition of this class of medication - recent labs did show evidence of severly increased albuminuria We will check hemoglobin A1c today

## 2022-11-23 NOTE — Progress Notes (Signed)
    Procedures performed today:    None.  Independent interpretation of notes and tests performed by another provider:   None.  Brief History, Exam, Impression, and Recommendations:    BP (!) 140/66   Pulse (!) 58   Ht 5' 10.5" (1.791 m)   Wt 199 lb 4.8 oz (90.4 kg)   SpO2 97%   BMI 28.19 kg/m   Type 2 diabetes mellitus with stage 3a chronic kidney disease, without long-term current use of insulin (HCC) Assessment & Plan: Recent hemoglobin A1c at goal at 6.4%.  Patient continues with metformin, denies any issues with medication.  He also continues with lifestyle modification.  Does have underlying stage IIIa CKD, proteinuria.  Currently taking losartan.  We did discuss potential role for SGLT2 inhibitor, discussed potential benefits related to kidney and heart protection, potential risks associated with medication.  For now, patient will consider potential addition of this class of medication - recent labs did show evidence of severly increased albuminuria We will check hemoglobin A1c today  Orders: -     POCT glycosylated hemoglobin (Hb A1C)  Need for influenza vaccination -     Flu Vaccine Trivalent High Dose (Fluad)  Need for pneumococcal vaccination -     Pneumococcal conjugate vaccine 20-valent  Primary hypertension Assessment & Plan: Blood pressure borderline in office, systolic slightly above goal, diastolic at goal.  Patient continues with atenolol, Imdur, losartan, nifedipine.  Denies any issues with medications this time.  Does check blood pressure at home, readings similar to that in office today No changes to medication regimen at this time, recommend continued follow-up with cardiology, we will follow-up in about 3 to 4 months.  Recommend DASH diet, intermittent monitoring of blood pressure at home    ___________________________________________ Eldene Plocher de [place], MD, ABFM, [facility]

## 2022-11-25 ENCOUNTER — Other Ambulatory Visit (HOSPITAL_BASED_OUTPATIENT_CLINIC_OR_DEPARTMENT_OTHER): Payer: Self-pay | Admitting: Family Medicine

## 2022-11-28 ENCOUNTER — Other Ambulatory Visit (HOSPITAL_BASED_OUTPATIENT_CLINIC_OR_DEPARTMENT_OTHER): Payer: Self-pay

## 2022-11-28 MED ORDER — METFORMIN HCL 500 MG PO TABS
500.0000 mg | ORAL_TABLET | Freq: Two times a day (BID) | ORAL | 1 refills | Status: DC
  Filled 2022-11-28: qty 180, 90d supply, fill #0
  Filled 2023-02-23: qty 180, 90d supply, fill #1

## 2022-12-10 ENCOUNTER — Other Ambulatory Visit (HOSPITAL_BASED_OUTPATIENT_CLINIC_OR_DEPARTMENT_OTHER): Payer: Self-pay | Admitting: Family Medicine

## 2022-12-11 ENCOUNTER — Other Ambulatory Visit (HOSPITAL_BASED_OUTPATIENT_CLINIC_OR_DEPARTMENT_OTHER): Payer: Self-pay

## 2022-12-11 MED ORDER — RIVAROXABAN 15 MG PO TABS
15.0000 mg | ORAL_TABLET | Freq: Every day | ORAL | 1 refills | Status: DC
  Filled 2022-12-11: qty 90, 90d supply, fill #0
  Filled 2023-03-15: qty 7, 7d supply, fill #1
  Filled 2023-03-26: qty 3, 3d supply, fill #2
  Filled 2023-03-29: qty 80, 80d supply, fill #3

## 2022-12-13 ENCOUNTER — Ambulatory Visit: Payer: Medicare Other | Admitting: Cardiovascular Disease

## 2022-12-20 ENCOUNTER — Ambulatory Visit (HOSPITAL_BASED_OUTPATIENT_CLINIC_OR_DEPARTMENT_OTHER): Payer: Medicare Other | Admitting: Cardiology

## 2022-12-26 NOTE — Assessment & Plan Note (Signed)
Blood pressure borderline in office, systolic slightly above goal, diastolic at goal.  Patient continues with atenolol, Imdur, losartan, nifedipine.  Denies any issues with medications this time.  Does check blood pressure at home, readings similar to that in office today No changes to medication regimen at this time, recommend continued follow-up with cardiology, we will follow-up in about 3 to 4 months.  Recommend DASH diet, intermittent monitoring of blood pressure at home

## 2022-12-30 ENCOUNTER — Other Ambulatory Visit (HOSPITAL_BASED_OUTPATIENT_CLINIC_OR_DEPARTMENT_OTHER): Payer: Self-pay

## 2023-01-24 ENCOUNTER — Other Ambulatory Visit (HOSPITAL_BASED_OUTPATIENT_CLINIC_OR_DEPARTMENT_OTHER): Payer: Self-pay | Admitting: Family Medicine

## 2023-01-25 ENCOUNTER — Other Ambulatory Visit (HOSPITAL_BASED_OUTPATIENT_CLINIC_OR_DEPARTMENT_OTHER): Payer: Self-pay

## 2023-01-25 MED ORDER — FAMOTIDINE 20 MG PO TABS
40.0000 mg | ORAL_TABLET | Freq: Every day | ORAL | 0 refills | Status: DC
  Filled 2023-01-25: qty 180, 90d supply, fill #0

## 2023-01-30 ENCOUNTER — Other Ambulatory Visit (HOSPITAL_BASED_OUTPATIENT_CLINIC_OR_DEPARTMENT_OTHER): Payer: Self-pay

## 2023-01-30 ENCOUNTER — Other Ambulatory Visit (HOSPITAL_BASED_OUTPATIENT_CLINIC_OR_DEPARTMENT_OTHER): Payer: Self-pay | Admitting: Family Medicine

## 2023-01-30 ENCOUNTER — Other Ambulatory Visit: Payer: Self-pay

## 2023-01-30 MED ORDER — ISOSORBIDE MONONITRATE ER 60 MG PO TB24
60.0000 mg | ORAL_TABLET | Freq: Every day | ORAL | 1 refills | Status: DC
  Filled 2023-01-30: qty 90, 90d supply, fill #0
  Filled 2023-04-29: qty 90, 90d supply, fill #1

## 2023-01-30 MED ORDER — ROSUVASTATIN CALCIUM 20 MG PO TABS
20.0000 mg | ORAL_TABLET | Freq: Every day | ORAL | 1 refills | Status: DC
  Filled 2023-01-30: qty 90, 90d supply, fill #0
  Filled 2023-04-29: qty 90, 90d supply, fill #1

## 2023-02-15 ENCOUNTER — Other Ambulatory Visit (HOSPITAL_BASED_OUTPATIENT_CLINIC_OR_DEPARTMENT_OTHER): Payer: Self-pay

## 2023-02-17 ENCOUNTER — Other Ambulatory Visit (HOSPITAL_BASED_OUTPATIENT_CLINIC_OR_DEPARTMENT_OTHER): Payer: Self-pay | Admitting: Family Medicine

## 2023-02-19 ENCOUNTER — Other Ambulatory Visit: Payer: Self-pay

## 2023-02-19 ENCOUNTER — Other Ambulatory Visit (HOSPITAL_BASED_OUTPATIENT_CLINIC_OR_DEPARTMENT_OTHER): Payer: Self-pay

## 2023-02-19 MED ORDER — NIFEDIPINE ER OSMOTIC RELEASE 60 MG PO TB24
60.0000 mg | ORAL_TABLET | Freq: Two times a day (BID) | ORAL | 0 refills | Status: DC
  Filled 2023-02-19: qty 180, 90d supply, fill #0

## 2023-02-20 ENCOUNTER — Other Ambulatory Visit: Payer: Self-pay

## 2023-02-20 ENCOUNTER — Encounter (HOSPITAL_BASED_OUTPATIENT_CLINIC_OR_DEPARTMENT_OTHER): Payer: Self-pay | Admitting: *Deleted

## 2023-02-23 ENCOUNTER — Other Ambulatory Visit (HOSPITAL_BASED_OUTPATIENT_CLINIC_OR_DEPARTMENT_OTHER): Payer: Self-pay

## 2023-02-27 ENCOUNTER — Ambulatory Visit (HOSPITAL_BASED_OUTPATIENT_CLINIC_OR_DEPARTMENT_OTHER): Payer: Medicare Other | Admitting: Family Medicine

## 2023-03-06 ENCOUNTER — Other Ambulatory Visit (HOSPITAL_BASED_OUTPATIENT_CLINIC_OR_DEPARTMENT_OTHER): Payer: Self-pay

## 2023-03-15 ENCOUNTER — Other Ambulatory Visit (HOSPITAL_BASED_OUTPATIENT_CLINIC_OR_DEPARTMENT_OTHER): Payer: Self-pay | Admitting: Family Medicine

## 2023-03-15 ENCOUNTER — Other Ambulatory Visit (HOSPITAL_BASED_OUTPATIENT_CLINIC_OR_DEPARTMENT_OTHER): Payer: Self-pay

## 2023-03-15 MED ORDER — ATENOLOL 100 MG PO TABS
100.0000 mg | ORAL_TABLET | Freq: Two times a day (BID) | ORAL | 1 refills | Status: DC
  Filled 2023-03-15: qty 15, 8d supply, fill #0
  Filled 2023-03-28: qty 15, 8d supply, fill #1
  Filled 2023-03-29 (×2): qty 180, 90d supply, fill #1
  Filled 2023-06-28: qty 165, 83d supply, fill #2

## 2023-03-23 ENCOUNTER — Encounter: Payer: Self-pay | Admitting: Cardiovascular Disease

## 2023-03-23 ENCOUNTER — Ambulatory Visit: Payer: Medicare Other | Attending: Cardiovascular Disease | Admitting: Cardiovascular Disease

## 2023-03-23 VITALS — BP 152/74 | HR 70 | Ht 70.0 in | Wt 200.0 lb

## 2023-03-23 DIAGNOSIS — I739 Peripheral vascular disease, unspecified: Secondary | ICD-10-CM

## 2023-03-23 NOTE — Assessment & Plan Note (Signed)
Frederick Tyler returns today for follow-up of PAD.  I saw him 6 months ago.  He did have Doppler studies performed 08/16/2022 revealing a right ABI of 0.58 and a left of 0.73.  He did have high-frequency stenosis right distal common femoral artery and SFA and occluded left SFA.  Was beginning to tell which never got started although today he says he really denies lifestyle-limiting claudication.  At this point, no further evaluation is warranted.  Should he get worsening or recurrent symptoms I would be happy to see him back and discuss alternative therapies.

## 2023-03-23 NOTE — Patient Instructions (Signed)
Medication Instructions:  Your physician recommends that you continue on your current medications as directed. Please refer to the Current Medication list given to you today.  *If you need a refill on your cardiac medications before your next appointment, please call your pharmacy*   Follow-Up: At [facility], you and your health needs are our priority.  As part of our continuing mission to provide you with exceptional heart care, we have created designated Provider Care Teams.  These Care Teams include your primary Cardiologist (physician) and Advanced Practice Providers (APPs -  Physician Assistants and Nurse Practitioners) who all work together to provide you with the care you need, when you need it.  We recommend signing up for the patient portal called "MyChart".  Sign up information is provided on this After Visit Summary.  MyChart is used to connect with patients for Virtual Visits (Telemedicine).  Patients are able to view lab/test results, encounter notes, upcoming appointments, etc.  Non-urgent messages can be sent to your provider as well.   To learn more about what you can do with MyChart, go to [email or web address].    Your next appointment:   We will see you on an as needed basis.  Provider:   Jonathan Berry, MD  

## 2023-03-23 NOTE — Progress Notes (Signed)
03/23/2023 [name]   02-24-1951  [id number]  Primary Physician de Peru, Raymond J, MD Primary Cardiologist: Runell Gess MD Nicholes Calamity, [name]  HPI:  Frederick Tyler is a 72 y.o.  mildly overweight married male father of 2, grandfather 2 grandchildren referred by Dr. Cristal Deer, his cardiologist, for peripheral arterial disease evaluation.  I last saw him in the office 09/15/2022.  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.  He relocated from [facility] to [place] 2 years ago to be closer to family.  His risk factors include remote tobacco abuse, treated hypertension, diabetes and hyperlipidemia.  He has a strong family history of heart disease with both parents and 2 brothers all of whom have had an ischemic heart disease.  He has had 2 TIAs in the past currently on Xarelto.  He has a cardiac catheterization but no stents in the past.  He has CKD with creatinines that run in the mid 1 range.  He is noticed claudication for the last 7 to 8 years, which is symmetric but not necessarily lifestyle limiting.  He had Doppler studies performed 08/16/2022 revealing a right ABI of 0.58 and a left of 0.73.  He did have moderate disease in his right SFA as well as tibial vessels and an occluded left SFA.   Since I saw him 6 months ago I did prescribe Pletal however it appears that this never got started.  In any event, he really denies lifestyle-limiting claudication at this time.  He also denies chest pain.  He had a cardiac PET study performed 08/02/2022 which was low risk and nonischemic.  Current Meds  Medication Sig   albuterol (VENTOLIN HFA) 108 (90 Base) MCG/ACT inhaler Inhale 1-2 puffs into the lungs every 6 (six) hours as needed for wheezing or shortness of breath.   aspirin 81 MG chewable tablet Chew 1 tablet (81 mg total) by mouth daily.   atenolol (TENORMIN) 100 MG tablet Take 1 tablet (100 mg total) by mouth 2  (two) times daily.   famotidine (PEPCID) 20 MG tablet Take 2 tablets (40 mg total) by mouth at bedtime.   isosorbide mononitrate (IMDUR) 60 MG 24 hr tablet Take 1 tablet (60 mg total) by mouth daily.   losartan (COZAAR) 50 MG tablet Take 1 tablet (50 mg total) by mouth in the morning and at bedtime.   metFORMIN (GLUCOPHAGE) 500 MG tablet Take 1 tablet (500 mg total) by mouth 2 (two) times daily with a meal.   NIFEdipine (PROCARDIA XL/NIFEDICAL XL) 60 MG 24 hr tablet Take 1 tablet (60 mg total) by mouth in the morning and at bedtime.   nitroGLYCERIN (NITROSTAT) 0.4 MG SL tablet Place 1 tablet (0.4 mg total) under the tongue every 5 (five) minutes as needed for chest pain (max dose of 3. At initiation of 3rd dose call 911.).   Probiotic Product (PROBIOTIC BLEND PO) Take by mouth. NuZymes   Rivaroxaban (XARELTO) 15 MG TABS tablet Take 1 tablet (15 mg total) by mouth daily with supper.   rosuvastatin (CRESTOR) 20 MG tablet Take 1 tablet (20 mg total) by mouth daily.     Allergies  Allergen Reactions   Grass Pollen(K-O-R-T-Swt Vern) Cough, Itching and Shortness Of Breath   Lipitor [Atorvastatin]     Liver issues     Social History   Socioeconomic History   Marital status: Married    Spouse name: Not on file  Number of children: Not on file   Years of education: Not on file   Highest education level: Bachelor's degree (e.g., BA, AB, BS)  Occupational History   Not on file  Tobacco Use   Smoking status: Former    Types: Cigarettes   Smokeless tobacco: Never  Vaping Use   Vaping status: Never Used  Substance and Sexual Activity   Alcohol use: Never   Drug use: Never   Sexual activity: Yes    Birth control/protection: None  Other Topics Concern   Not on file  Social History Narrative   Not on file   Social Drivers of Health   Financial Resource Strain: Low Risk  (10/10/2022)   Overall Financial Resource Strain (CARDIA)    Difficulty of Paying Living Expenses: Not hard at all   Food Insecurity: No Food Insecurity (10/10/2022)   Hunger Vital Sign    Worried About Running Out of Food in the Last Year: Never true    Ran Out of Food in the Last Year: Never true  Transportation Needs: No Transportation Needs (10/10/2022)   PRAPARE - Administrator, Civil Service (Medical): No    Lack of Transportation (Non-Medical): No  Physical Activity: Sufficiently Active (10/10/2022)   Exercise Vital Sign    Days of Exercise per Week: 7 days    Minutes of Exercise per Session: 60 min  Stress: No Stress Concern Present (10/10/2022)   [employer] of Occupational Health - Occupational Stress Questionnaire    Feeling of Stress : Not at all  Social Connections: Socially Integrated (10/10/2022)   Social Connection and Isolation Panel [NHANES]    Frequency of Communication with Friends and Family: More than three times a week    Frequency of Social Gatherings with Friends and Family: Three times a week    Attends Religious Services: More than 4 times per year    Active Member of Clubs or Organizations: Yes    Attends Banker Meetings: More than 4 times per year    Marital Status: Married  Catering manager Violence: Not At Risk (10/10/2022)   Humiliation, Afraid, Rape, and Kick questionnaire    Fear of Current or Ex-Partner: No    Emotionally Abused: No    Physically Abused: No    Sexually Abused: No     Review of Systems: General: negative for chills, fever, night sweats or weight changes.  Cardiovascular: negative for chest pain, dyspnea on exertion, edema, orthopnea, palpitations, paroxysmal nocturnal dyspnea or shortness of breath Dermatological: negative for rash Respiratory: negative for cough or wheezing Urologic: negative for hematuria Abdominal: negative for nausea, vomiting, diarrhea, bright red blood per rectum, melena, or hematemesis Neurologic: negative for visual changes, syncope, or dizziness All other systems reviewed and are otherwise  negative except as noted above.    Blood pressure (!) 152/74, pulse 70, height 5\' 10"  (1.778 m), weight 200 lb (90.7 kg), SpO2 98%.  General appearance: alert and no distress Neck: no adenopathy, no JVD, supple, symmetrical, trachea midline, thyroid not enlarged, symmetric, no tenderness/mass/nodules, and soft left carotid bruit Lungs: clear to auscultation bilaterally Heart: regular rate and rhythm, S1, S2 normal, no murmur, click, rub or gallop Extremities: extremities normal, atraumatic, no cyanosis or edema Pulses: Pedal pulses Skin: Skin color, texture, turgor normal. No rashes or lesions Neurologic: Grossly normal  EKG EKG Interpretation Date/Time:  Friday March 23 2023 13:26:38 EST Ventricular Rate:  70 PR Interval:  256 QRS Duration:  82 QT Interval:  390  QTC Calculation: 421 R Axis:   65  Text Interpretation: Sinus rhythm with 1st degree A-V block When compared with ECG of 31-Dec-2020 13:03, PREVIOUS ECG IS PRESENT Confirmed by Nanetta Batty [phone]) on 03/23/2023 1:31:20 PM    ASSESSMENT AND PLAN:   Peripheral arterial disease (HCC) Mr. Suydam returns today for follow-up of PAD.  I saw him 6 months ago.  He did have Doppler studies performed 08/16/2022 revealing a right ABI of 0.58 and a left of 0.73.  He did have high-frequency stenosis right distal common femoral artery and SFA and occluded left SFA.  Was beginning to tell which never got started although today he says he really denies lifestyle-limiting claudication.  At this point, no further evaluation is warranted.  Should he get worsening or recurrent symptoms I would be happy to see him back and discuss alternative therapies.     Runell Gess MD FACP,FACC,FAHA, [facility] 03/23/2023 1:41 PM

## 2023-03-26 ENCOUNTER — Other Ambulatory Visit (HOSPITAL_BASED_OUTPATIENT_CLINIC_OR_DEPARTMENT_OTHER): Payer: Self-pay

## 2023-03-29 ENCOUNTER — Other Ambulatory Visit (HOSPITAL_BASED_OUTPATIENT_CLINIC_OR_DEPARTMENT_OTHER): Payer: Self-pay

## 2023-03-29 DIAGNOSIS — K080 Exfoliation of teeth due to systemic causes: Secondary | ICD-10-CM | POA: Diagnosis not present

## 2023-04-02 ENCOUNTER — Ambulatory Visit (INDEPENDENT_AMBULATORY_CARE_PROVIDER_SITE_OTHER): Payer: Medicare Other | Admitting: Family Medicine

## 2023-04-02 ENCOUNTER — Ambulatory Visit (HOSPITAL_BASED_OUTPATIENT_CLINIC_OR_DEPARTMENT_OTHER): Payer: Medicare Other | Admitting: Cardiology

## 2023-04-02 ENCOUNTER — Other Ambulatory Visit (HOSPITAL_BASED_OUTPATIENT_CLINIC_OR_DEPARTMENT_OTHER): Payer: Self-pay

## 2023-04-02 ENCOUNTER — Encounter (HOSPITAL_BASED_OUTPATIENT_CLINIC_OR_DEPARTMENT_OTHER): Payer: Self-pay | Admitting: Cardiology

## 2023-04-02 ENCOUNTER — Encounter (HOSPITAL_BASED_OUTPATIENT_CLINIC_OR_DEPARTMENT_OTHER): Payer: Self-pay | Admitting: Family Medicine

## 2023-04-02 VITALS — BP 150/65 | HR 66 | Ht 70.0 in | Wt 199.8 lb

## 2023-04-02 VITALS — BP 130/60 | HR 67 | Resp 17 | Ht 70.0 in | Wt 201.0 lb

## 2023-04-02 DIAGNOSIS — N1831 Chronic kidney disease, stage 3a: Secondary | ICD-10-CM

## 2023-04-02 DIAGNOSIS — Z7984 Long term (current) use of oral hypoglycemic drugs: Secondary | ICD-10-CM | POA: Diagnosis not present

## 2023-04-02 DIAGNOSIS — E1122 Type 2 diabetes mellitus with diabetic chronic kidney disease: Secondary | ICD-10-CM

## 2023-04-02 DIAGNOSIS — I10 Essential (primary) hypertension: Secondary | ICD-10-CM

## 2023-04-02 DIAGNOSIS — J329 Chronic sinusitis, unspecified: Secondary | ICD-10-CM

## 2023-04-02 DIAGNOSIS — I2510 Atherosclerotic heart disease of native coronary artery without angina pectoris: Secondary | ICD-10-CM

## 2023-04-02 DIAGNOSIS — E781 Pure hyperglyceridemia: Secondary | ICD-10-CM | POA: Insufficient documentation

## 2023-04-02 DIAGNOSIS — I739 Peripheral vascular disease, unspecified: Secondary | ICD-10-CM | POA: Diagnosis not present

## 2023-04-02 DIAGNOSIS — I6523 Occlusion and stenosis of bilateral carotid arteries: Secondary | ICD-10-CM | POA: Diagnosis not present

## 2023-04-02 LAB — HEMOGLOBIN A1C
Est. average glucose Bld gHb Est-mCnc: 151 mg/dL
Hgb A1c MFr Bld: 6.9 % — ABNORMAL HIGH (ref 4.8–5.6)

## 2023-04-02 NOTE — Patient Instructions (Signed)

## 2023-04-02 NOTE — Assessment & Plan Note (Signed)
 Patient continues to have sinus congestion, pressure which has been ongoing for a few awhile now.  Has been utilizing nasal sprays, OTC medications without significant relief.  Pressure primarily is located over frontal sinuses. Does have some tenderness to palpation over frontal sinuses today. Given continued symptoms despite current measures, will refer to ENT for further evaluation and recommendations - did refer previously, however patient did not set up at that time. For now, can continue with nasal saline spray, intranasal steroid spray.  Discussed OTC antihistamine, however patient indicates that antihistamine tends to have adverse effects on prostate/urinary symptoms

## 2023-04-02 NOTE — Assessment & Plan Note (Signed)
 Recent hemoglobin A1c at goal at 6.7%.  He continues with metformin , denies any issues with medication.  He also continues with lifestyle modifications.   Does have underlying stage IIIa CKD, proteinuria.  Currently taking losartan . We again discussed potential role for SGLT2 inhibitor, discussed potential benefits related to kidney and heart protection, potential risks associated with medication.  For now, patient will consider potential addition of this class of medication -prior labs did show evidence of severly increased albuminuria Patient also with symptoms related to diabetic neuropathy.  He does have peripheral vascular disease with claudication present.  On discussion, it does appear that symptoms are more likely related to neuropathy as opposed to claudication.  We discussed potential interventions including pharmacotherapy.  At this time, patient does not feel that symptoms are impactful enough to warrant initiating pharmacotherapy.  We did discuss conservative measures which can be utilized.  Will continue to monitor moving forward and if symptoms become more impactful, consider initiating medication such as gabapentin. We will check hemoglobin A1c today as well as urine microalbumin/creatinine ratio

## 2023-04-02 NOTE — Progress Notes (Addendum)
 Cardiology Office Note:  .   Date:  04/02/2023  ID:  Frederick Tyler, DOB 04-24-1950, MRN [id number] PCP: de Tyler, Frederick J, MD  [facility] Providers Cardiologist:  Shelda Bruckner, MD {  History of Present Illness: Frederick Tyler is a 73 y.o. male PMH hypertension, TIA, CAD (denies PCI, 2 diagnostic caths), PAD, palpitations, type II diabetes with stage 3a CKD.   Pertinent CV history:  Follows with Dr. Court for PAD Testing here: PET scan 07/2022: no ischemia or infarct. May have decreased myocardial blood flow reserve Carotids 07/2022: mild bilateral stenosis 1-39%. ABI 07/2022: moderate bilateral PAD, referred to Dr. Court [place] arterial 07/2022: 50-74% at distal CFA, mid SFA, and distal SFA on the right. Total occlusion in SFA, possible collateral seen in mid distal SFA.  [facility] testing: Echo 06/06/19: EF 60-65%, no WMA. Normal RV. No significant valve disease. Normal atrial sizes. Normal aortic root   30 day event monitor 01/19/2017: no afib detected   Nuclear stress 10/16/2016: no ischemia   Cath 10/21/2012:  Dominance: Right LM: OK LAD: mid 50-60% LCX: nonobstructive RCA: moderate mid vessel disease with focal 70% PDA LVEDP: 14 mmHg    Today: Overall doing well. Does have moments during stress that he feels that his heart is faster/beating stronger than usual. He starts meditating and his symptoms improve. Has not noticed this other than during stressful times.   Does feel that walking uphill, climbing stairs makes him more short of breath, but this is stable for him. He walks for 2 hours every day, occasionally has fatigue after but not severe. He has tightness in his feet with walking but no severe claudication. This is usually early in his walk, and then after about 2 miles he can walk all day.  Has bad sinus pressure daily, has bad allergies. Has occasional vertigo.  Checks blood pressure at home, has been slightly higher recently. Usually 130s/60s unless his  headache is bad, then can be 150s systolic.  ROS: Denies chest pain, shortness of breath at rest or with normal exertion. No PND, orthopnea, LE edema or unexpected weight gain. No syncope. ROS otherwise negative except as noted.   Studies Reviewed: SABRA    EKG:  EKG Interpretation Date/Time:  Monday April 02 2023 16:22:50 EST Ventricular Rate:  65 PR Interval:  266 QRS Duration:  82 QT Interval:  408 QTC Calculation: 424 R Axis:   67  Text Interpretation: Sinus rhythm with 1st degree A-V block When compared with ECG of 23-Mar-2023 13:26, No significant change was found Confirmed by Bruckner Shelda [phone]) on 04/02/2023 4:28:47 PM    Physical Exam:   VS:  BP (!) 150/60 (BP Location: Left Arm, Patient Position: Sitting, Cuff Size: Normal)   Pulse 67   Resp 17   Ht 5' 10 (1.778 m)   Wt 201 lb (91.2 kg)   SpO2 95%   BMI 28.84 kg/m    Wt Readings from Last 3 Encounters:  04/02/23 201 lb (91.2 kg)  04/02/23 199 lb 12.8 oz (90.6 kg)  03/23/23 200 lb (90.7 kg)    GEN: Well nourished, well developed in no acute distress HEENT: Normal, moist mucous membranes NECK: No JVD CARDIAC: regular rhythm, normal S1 and S2, no rubs or gallops. No murmur. VASCULAR: Radial pulses 2+ bilaterally. Soft carotid bruit L>R. Diminished pedal pulses RESPIRATORY:  Clear to auscultation without rales, wheezing or rhonchi  ABDOMEN: Soft, non-tender, non-distended MUSCULOSKELETAL:  Ambulates independently SKIN: Warm and dry, no edema  NEUROLOGIC:  Alert and oriented x 3. No focal neuro deficits noted. PSYCHIATRIC:  Normal affect    ASSESSMENT AND PLAN: .    CAD Chest pain Shortness of breath on exertion PAD with claudication History of TIA-->cerebellar CVA based on records Bilateral carotid bruits -has been on rivaroxaban  15 mg after TIA (?CVA) from neurology. No clear history of afib that I can see -on aspirin  as well -saw Dr Court for PAD, following PRN -walks extensively to keep PAD  managed. No rest pain or nonhealing wounds. -chest pain now improved. PET without ischemia, with significant coronary calcifications -carotids with bilateral mild stenosis -continue aspirin  81 mg daily (though caution given Xarelto  as above) -continue rosuvastatin  20 mg daily. LDL 34 -continue beta blocker, imdur  -consider lp(a) -reviewed red flag warning signs that need immediate medical attention   Hypertension, with component of white coat hypertension -BP much better on home machine -continue imdur , losartan , nifedipine , atenolol    Palpitations -improved, continue to monitor -continue beta blocker   Type II diabetes with stage 3a CKD -would typically consider SGLT2i, though given PAD would avoid canagliflozin -current on metformin   CV risk counseling and prevention -recommend heart healthy/Mediterranean diet, with whole grains, fruits, vegetable, fish, lean meats, nuts, and olive oil. Limit salt. -recommend moderate walking, 3-5 times/week for 30-50 minutes each session. Aim for at least 150 minutes.week. Goal should be pace of 3 miles/hours, or walking 1.5 miles in 30 minutes -recommend avoidance of tobacco products. Avoid excess alcohol.  Dispo: 6 mos  Signed, Shelda Bruckner, MD   Shelda Bruckner, MD, PhD, [facility]  [facility]  [place]  [facility] & Vascular at [facility] [address] [place], [place] [address] [phone]

## 2023-04-02 NOTE — Patient Instructions (Signed)
  Medication Instructions:  Your physician recommends that you continue on your current medications as directed. Please refer to the Current Medication list given to you today. --If you need a refill on any your medications before your next appointment, please call your pharmacy first. If no refills are authorized on file call the office.-- Lab Work: Your physician has recommended that you have lab work today: yes If you have labs (blood work) drawn today and your tests are completely normal, you will receive your results via MyChart message OR a phone call from our staff.  Please ensure you check your voicemail in the event that you authorized detailed messages to be left on a delegated number. If you have any lab test that is abnormal or we need to change your treatment, we will call you to review the results.  Referrals/Procedures/Imaging: yes  Follow-Up: Your next appointment:   Your physician recommends that you schedule a follow-up appointment in: 3-4 months with Dr. de [place]  You will receive a text message or e-mail with a link to a survey about your care and experience with us  today! We would greatly appreciate your feedback!   Thanks for letting us  be apart of your health journey!!  Primary Care and Sports Medicine   Dr. Quintin sheerer Cuba   We encourage you to activate your patient portal called MyChart.  Sign up information is provided on this After Visit Summary.  MyChart is used to connect with patients for Virtual Visits (Telemedicine).  Patients are able to view lab/test results, encounter notes, upcoming appointments, etc.  Non-urgent messages can be sent to your provider as well. To learn more about what you can do with MyChart, please visit --  [email or web address].

## 2023-04-02 NOTE — Progress Notes (Signed)
    Procedures performed today:    None.  Independent interpretation of notes and tests performed by another provider:   None.  Brief History, Exam, Impression, and Recommendations:    BP (!) 150/65   Pulse 66   Ht 5' 10 (1.778 m)   Wt 199 lb 12.8 oz (90.6 kg)   SpO2 98%   BMI 28.67 kg/m   Type 2 diabetes mellitus with stage 3a chronic kidney disease, without long-term current use of insulin (HCC) Assessment & Plan: Recent hemoglobin A1c at goal at 6.7%.  He continues with metformin , denies any issues with medication.  He also continues with lifestyle modifications.   Does have underlying stage IIIa CKD, proteinuria.  Currently taking losartan . We again discussed potential role for SGLT2 inhibitor, discussed potential benefits related to kidney and heart protection, potential risks associated with medication.  For now, patient will consider potential addition of this class of medication -prior labs did show evidence of severly increased albuminuria Patient also with symptoms related to diabetic neuropathy.  He does have peripheral vascular disease with claudication present.  On discussion, it does appear that symptoms are more likely related to neuropathy as opposed to claudication.  We discussed potential interventions including pharmacotherapy.  At this time, patient does not feel that symptoms are impactful enough to warrant initiating pharmacotherapy.  We did discuss conservative measures which can be utilized.  Will continue to monitor moving forward and if symptoms become more impactful, consider initiating medication such as gabapentin. We will check hemoglobin A1c today as well as urine microalbumin/creatinine ratio  Orders: -     Microalbumin / creatinine urine ratio  Chronic sinusitis, unspecified location Assessment & Plan: Patient continues to have sinus congestion, pressure which has been ongoing for a few awhile now.  Has been utilizing nasal sprays, OTC medications  without significant relief.  Pressure primarily is located over frontal sinuses. Does have some tenderness to palpation over frontal sinuses today. Given continued symptoms despite current measures, will refer to ENT for further evaluation and recommendations - did refer previously, however patient did not set up at that time. For now, can continue with nasal saline spray, intranasal steroid spray.  Discussed OTC antihistamine, however patient indicates that antihistamine tends to have adverse effects on prostate/urinary symptoms  Orders: -     Ambulatory referral to ENT  Return in about 3 months (around 07/01/2023) for diabetes.  Spent 35 minutes on this patient encounter, including preparation, chart review, face-to-face counseling with patient and coordination of care, and documentation of encounter   ___________________________________________ Kinjal Neitzke de [place], MD, ABFM, [facility] Primary Care and Sports Medicine [facility]

## 2023-04-03 LAB — MICROALBUMIN / CREATININE URINE RATIO
Creatinine, Urine: 257 mg/dL
Microalb/Creat Ratio: 1164 mg/g{creat} — ABNORMAL HIGH (ref 0–29)
Microalbumin, Urine: 2990.8 ug/mL

## 2023-04-04 ENCOUNTER — Encounter (INDEPENDENT_AMBULATORY_CARE_PROVIDER_SITE_OTHER): Payer: Self-pay | Admitting: Otolaryngology

## 2023-04-11 ENCOUNTER — Other Ambulatory Visit (HOSPITAL_BASED_OUTPATIENT_CLINIC_OR_DEPARTMENT_OTHER): Payer: Self-pay

## 2023-04-29 ENCOUNTER — Other Ambulatory Visit (HOSPITAL_BASED_OUTPATIENT_CLINIC_OR_DEPARTMENT_OTHER): Payer: Self-pay | Admitting: Family Medicine

## 2023-04-30 ENCOUNTER — Other Ambulatory Visit (HOSPITAL_BASED_OUTPATIENT_CLINIC_OR_DEPARTMENT_OTHER): Payer: Self-pay

## 2023-04-30 MED ORDER — FAMOTIDINE 20 MG PO TABS
40.0000 mg | ORAL_TABLET | Freq: Every day | ORAL | 0 refills | Status: DC
  Filled 2023-04-30: qty 180, 90d supply, fill #0

## 2023-05-02 DIAGNOSIS — H52203 Unspecified astigmatism, bilateral: Secondary | ICD-10-CM | POA: Diagnosis not present

## 2023-05-02 LAB — HM DIABETES EYE EXAM

## 2023-05-03 ENCOUNTER — Encounter (HOSPITAL_BASED_OUTPATIENT_CLINIC_OR_DEPARTMENT_OTHER): Payer: Self-pay | Admitting: *Deleted

## 2023-05-17 ENCOUNTER — Other Ambulatory Visit: Payer: Self-pay

## 2023-05-17 ENCOUNTER — Other Ambulatory Visit (HOSPITAL_BASED_OUTPATIENT_CLINIC_OR_DEPARTMENT_OTHER): Payer: Self-pay

## 2023-05-17 ENCOUNTER — Other Ambulatory Visit (HOSPITAL_BASED_OUTPATIENT_CLINIC_OR_DEPARTMENT_OTHER): Payer: Self-pay | Admitting: Family Medicine

## 2023-05-17 DIAGNOSIS — I10 Essential (primary) hypertension: Secondary | ICD-10-CM

## 2023-05-17 MED ORDER — METFORMIN HCL 500 MG PO TABS
500.0000 mg | ORAL_TABLET | Freq: Two times a day (BID) | ORAL | 1 refills | Status: DC
  Filled 2023-05-17: qty 180, 90d supply, fill #0
  Filled 2023-08-23: qty 180, 90d supply, fill #1

## 2023-05-17 MED ORDER — LOSARTAN POTASSIUM 50 MG PO TABS
50.0000 mg | ORAL_TABLET | Freq: Two times a day (BID) | ORAL | 1 refills | Status: DC
  Filled 2023-05-17: qty 180, 90d supply, fill #0
  Filled 2023-08-16: qty 180, 90d supply, fill #1

## 2023-05-17 MED ORDER — NIFEDIPINE ER OSMOTIC RELEASE 60 MG PO TB24
60.0000 mg | ORAL_TABLET | Freq: Two times a day (BID) | ORAL | 0 refills | Status: DC
  Filled 2023-05-17: qty 180, 90d supply, fill #0

## 2023-06-06 ENCOUNTER — Encounter (INDEPENDENT_AMBULATORY_CARE_PROVIDER_SITE_OTHER): Payer: Self-pay | Admitting: Otolaryngology

## 2023-06-06 ENCOUNTER — Ambulatory Visit (INDEPENDENT_AMBULATORY_CARE_PROVIDER_SITE_OTHER): Payer: Medicare Other | Admitting: Otolaryngology

## 2023-06-06 VITALS — BP 153/68 | HR 68 | Ht 70.0 in | Wt 200.0 lb

## 2023-06-06 DIAGNOSIS — R0989 Other specified symptoms and signs involving the circulatory and respiratory systems: Secondary | ICD-10-CM

## 2023-06-06 DIAGNOSIS — Z9109 Other allergy status, other than to drugs and biological substances: Secondary | ICD-10-CM

## 2023-06-06 DIAGNOSIS — R519 Headache, unspecified: Secondary | ICD-10-CM | POA: Diagnosis not present

## 2023-06-06 DIAGNOSIS — R0981 Nasal congestion: Secondary | ICD-10-CM

## 2023-06-06 DIAGNOSIS — K219 Gastro-esophageal reflux disease without esophagitis: Secondary | ICD-10-CM | POA: Diagnosis not present

## 2023-06-06 DIAGNOSIS — J343 Hypertrophy of nasal turbinates: Secondary | ICD-10-CM | POA: Diagnosis not present

## 2023-06-06 DIAGNOSIS — J3089 Other allergic rhinitis: Secondary | ICD-10-CM

## 2023-06-06 DIAGNOSIS — J342 Deviated nasal septum: Secondary | ICD-10-CM | POA: Diagnosis not present

## 2023-06-06 NOTE — Progress Notes (Unsigned)
 ENT CONSULT:  Reason for Consult: chronic nasal congestion and hx of environmental allergies  throat clearing  HPI: Discussed the use of AI scribe software for clinical note transcription with the patient, who gave verbal consent to proceed.  History of Present Illness   The patient is a 23 yoM who presents with chronic nasal congestion and headaches/facial pressure/discomfort and sensation of phlegm and throat clearing.    He experiences chronic nasal congestion and headaches, which have worsened since moving from [place] a few years ago. He describes a sensation of pressure on top of his eyes and intermittent headaches rated as 5 out of 10 in severity. Congestion is accompanied by mucus in his throat, particularly at night, sometimes requiring him to cough to clear it. No frequent sinus infections requiring antibiotics.  He has a history of allergies to grasses and dust mites diagnosed in [place], for which he received immunotherapy injections for two years, though he is unsure of their effectiveness. Since moving, he has not been allergy tested, and symptoms have worsened. He uses Flonase nasal spray and saline for congestion but avoids oral antihistamines due to past side effects on his prostate (reports prostate enlargement after initiation of antihistamines).  He experiences occasional watery eyes associated with his headaches, which can occur on either side of his head. His headaches improve with Tylenol. No consistent productive cough.  He has a history of heartburn/reflux and takes omeprazole once daily. His eyes water after eating meals, which he associates with his nasal symptoms.  He has a history of diabetes managed with metformin and a past light stroke for which he takes Xarelto. He quit smoking 20 years ago, which he believes improved his nasal symptoms. He has upper dentures.  [place] daughter, who attended law school in the area.    Records Reviewed:  PCP office visit 11/23/22  Type 2 diabetes mellitus with stage 3a chronic kidney disease, without long-term current use of insulin (HCC) Assessment & Plan: Recent hemoglobin A1c at goal at 6.4%.  Patient continues with metformin, denies any issues with medication.  He also continues with lifestyle modification.  Does have underlying stage IIIa CKD, proteinuria.  Currently taking losartan.  We did discuss potential role for SGLT2 inhibitor, discussed potential benefits related to kidney and heart protection, potential risks associated with medication.  For now, patient will consider potential addition of this class of medication - recent labs did show evidence of severly increased albuminuria We will check hemoglobin A1c today    Past Medical History:  Diagnosis Date   Chronic kidney disease    GERD (gastroesophageal reflux disease)    Hypertension     History reviewed. No pertinent surgical history.  Family History  Problem Relation Age of Onset   Heart attack Mother    Heart attack Father    Heart disease Brother    Heart disease Brother    Heart disease Brother     Social History:  reports that he quit smoking about 29 years ago. His smoking use included cigarettes. He has never used smokeless tobacco. He reports current alcohol use of about 2.0 standard drinks of alcohol per week. He reports that he does not use drugs.  Allergies:  Allergies  Allergen Reactions   Grass Pollen(K-O-R-T-Swt Vern) Cough, Itching and Shortness Of Breath   Lipitor [Atorvastatin]     Liver issues    Lisinopril     Cough    Medications:  I have reviewed the patient's current medications.  The PMH, PSH, Medications, Allergies, and SH were reviewed and updated.  ROS: Constitutional: Negative for fever, weight loss and weight gain. Cardiovascular: Negative for chest pain and dyspnea on exertion. Respiratory: Is not experiencing shortness of breath at rest. Gastrointestinal:  Negative for nausea and vomiting. Neurological: Negative for headaches. Psychiatric: The patient is not nervous/anxious  Blood pressure (!) 153/68, pulse 68, height 5\' 10"  (1.778 m), weight 200 lb (90.7 kg), SpO2 96%. Body mass index is 28.7 kg/m.  PHYSICAL EXAM:  Exam: General: Well-developed, well-nourished Communication and Voice: Clear pitch and clarity Respiratory Respiratory effort: Equal inspiration and expiration without stridor Cardiovascular Peripheral Vascular: Warm extremities with equal color/perfusion Eyes: No nystagmus with equal extraocular motion bilaterally Neuro/Psych/Balance: Patient oriented to person, place, and time; Appropriate mood and affect; Gait is intact with no imbalance; Cranial nerves I-XII are intact Head and Face Inspection: Normocephalic and atraumatic without mass or lesion Palpation: Facial skeleton intact without bony stepoffs Salivary Glands: No mass or tenderness Facial Strength: Facial motility symmetric and full bilaterally ENT Pinna: External ear intact and fully developed External canal: Canal is patent with intact skin Tympanic Membrane: Clear and mobile External Nose: No scar or anatomic deformity Internal Nose: Septum is deviated to the left with narrowing of the left nasal passage. No polyp, or purulence. Mucosal edema and erythema present.  Bilateral inferior turbinate hypertrophy.  Lips, Teeth, and gums: Mucosa and teeth intact and viable TMJ: No pain to palpation with full mobility Oral cavity/oropharynx: No erythema or exudate, no lesions present Nasopharynx: No mass or lesion with intact mucosa Hypopharynx: Intact mucosa without pooling of secretions Larynx Glottic: Full true vocal cord mobility without lesion or mass Supraglottic: Normal appearing epiglottis and AE folds Interarytenoid Space: Moderate pachydermia&edema Subglottic Space: Patent without lesion or edema Neck Neck and Trachea: Midline trachea without mass or  lesion Thyroid: No mass or nodularity Lymphatics: No lymphadenopathy  Procedure:  Preoperative diagnosis: throat clearing and globus   Postoperative diagnosis:   Same + GERD LPR  Procedure: Flexible fiberoptic laryngoscopy  Surgeon: Ashok Croon, MD  Anesthesia: Topical lidocaine and Afrin Complications: None Condition is stable throughout exam  Indications and consent:  The patient presents to the clinic with above symptoms. Indirect laryngoscopy view was incomplete. Thus it was recommended that they undergo a flexible fiberoptic laryngoscopy. All of the risks, benefits, and potential complications were reviewed with the patient preoperatively and verbal informed consent was obtained.  Procedure: The patient was seated upright in the clinic. Topical lidocaine and Afrin were applied to the nasal cavity. After adequate anesthesia had occurred, I then proceeded to pass the flexible telescope into the nasal cavity. The nasal cavity was patent without rhinorrhea or polyp. The nasopharynx was also patent without mass or lesion. The base of tongue was visualized and was normal. There were no signs of pooling of secretions in the piriform sinuses. The true vocal folds were mobile bilaterally. There were no signs of glottic or supraglottic mucosal lesion or mass. There was moderate interarytenoid pachydermia and post cricoid edema. The telescope was then slowly withdrawn and the patient tolerated the procedure throughout.   PROCEDURE NOTE: nasal endoscopy  Preoperative diagnosis: chronic nasal congestion symptoms  Postoperative diagnosis: same  Procedure: Diagnostic nasal endoscopy (96295)  Surgeon: Ashok Croon, M.D.  Anesthesia: Topical lidocaine and Afrin  H&P REVIEW: The patient's history and physical were reviewed today prior to procedure. All medications were reviewed and updated as well. Complications: None  Condition is stable throughout exam Indications and consent: The  patient presents with symptoms of chronic sinusitis not responding to previous therapies. All the risks, benefits, and potential complications were reviewed with the patient preoperatively and informed consent was obtained. The time out was completed with confirmation of the correct procedure.   Procedure: The patient was seated upright in the clinic. Topical lidocaine and Afrin were applied to the nasal cavity. After adequate anesthesia had occurred, the rigid nasal endoscope was passed into the nasal cavity. The nasal mucosa, turbinates, septum, and sinus drainage pathways were visualized bilaterally. This revealed no purulence or significant secretions that might be cultured. There were no polyps or sites of significant inflammation. The mucosa was intact and there was no crusting present. The scope was then slowly withdrawn and the patient tolerated the procedure well. There were no complications or blood loss.    Studies Reviewed: CXR 05/06/21 IMPRESSION: No active cardiopulmonary disease. Done for acute cough.  Assessment/Plan: Encounter Diagnoses  Name Primary?   Allergy to environmental factors Yes   Chronic nasal congestion    Nonintractable episodic headache, unspecified headache type     Assessment and Plan    Chronic Nasal Congestion Chronic nasal congestion with pressure on top of the eyes, headaches, and mucus sensation in the throat. Symptoms worsened after relocating from [place] to the [place], indicating possible new environmental allergies. He was previously on allergy shots in [place] x 2 yrs. Nasal endoscopy showed septal deviation/ITH contributing to nasal congestion. Differential diagnosis includes environmental allergies & septal deviation/ITH likely causing some of his sx. Cannot rule out CRS as a cause of headaches, vs headaches/facial pressure not related to sinuses/headaches of other etiology. Medical management is the first line of treatment, with surgical intervention to  correct the septum/ITH as a second-line option if symptoms persist. - Refer to allergy specialist for retesting to identify potential new allergens in the current environment. - Order sinus CT imaging to evaluate for sinus-related headaches and rule out chronic sinusitis. - Continue Flonase nasal spray, increase to two puffs per nostril twice daily. - Recommend saline nasal rinses with a NeilMed bottle to remove allergens from the nasal passages.  Gastroesophageal Reflux Disease (GERD) GERD hx and evidence of findings on flexible scope exam c/w GERD LPR likely contributing to sensation of phlegm and cough. Managed with omeprazole. Reflux Gourmet, a seaweed-based supplement, is recommended to coat stomach contents and prevent throat irritation. - Continue omeprazole once daily. - Recommend trial of Reflux Gourmet after meals  Chronic nasal congestion and post-nasal drainage Evidence of post-nasal drainage during scope exam today, could be contributing to her sx - consider trial of Zyrtec 10 mg daily if safe from prostate hypertrophy standpoint - Flonase 2 puffs b/l nares BID - consider nasal saline rinses   Septal Deviation and Inferior Turbinate Hypertrophy - medical management of nasal congestion as above  Hx of stroke on Xarelto  Follow-up Reassess after allergy testing and sinus imaging. - Schedule follow-up appointment after allergy testing and sinus imaging.          Thank you for allowing me to participate in the care of this patient. Please do not hesitate to contact me with any questions or concerns.   Ashok Croon, MD Otolaryngology [facility] Phone: [phone] Fax: [phone]    06/07/2023, 3:02 AM

## 2023-06-06 NOTE — Patient Instructions (Addendum)

## 2023-06-14 ENCOUNTER — Other Ambulatory Visit (HOSPITAL_BASED_OUTPATIENT_CLINIC_OR_DEPARTMENT_OTHER): Payer: Self-pay | Admitting: Family Medicine

## 2023-06-14 ENCOUNTER — Other Ambulatory Visit (HOSPITAL_BASED_OUTPATIENT_CLINIC_OR_DEPARTMENT_OTHER): Payer: Self-pay

## 2023-06-14 MED ORDER — RIVAROXABAN 15 MG PO TABS
15.0000 mg | ORAL_TABLET | Freq: Every day | ORAL | 1 refills | Status: DC
  Filled 2023-06-14: qty 90, 90d supply, fill #0

## 2023-06-28 ENCOUNTER — Other Ambulatory Visit (HOSPITAL_BASED_OUTPATIENT_CLINIC_OR_DEPARTMENT_OTHER): Payer: Self-pay

## 2023-06-28 ENCOUNTER — Ambulatory Visit: Admitting: Allergy

## 2023-06-28 ENCOUNTER — Encounter: Payer: Self-pay | Admitting: Allergy

## 2023-06-28 ENCOUNTER — Other Ambulatory Visit: Payer: Self-pay

## 2023-06-28 VITALS — BP 120/64 | HR 65 | Temp 97.9°F | Resp 16 | Ht 68.75 in | Wt 199.5 lb

## 2023-06-28 DIAGNOSIS — H1013 Acute atopic conjunctivitis, bilateral: Secondary | ICD-10-CM

## 2023-06-28 DIAGNOSIS — H109 Unspecified conjunctivitis: Secondary | ICD-10-CM | POA: Diagnosis not present

## 2023-06-28 DIAGNOSIS — J310 Chronic rhinitis: Secondary | ICD-10-CM | POA: Diagnosis not present

## 2023-06-28 MED ORDER — FLUTICASONE PROPIONATE 50 MCG/ACT NA SUSP
2.0000 | Freq: Every day | NASAL | 5 refills | Status: DC
  Filled 2023-06-28: qty 16, 30d supply, fill #0

## 2023-06-28 MED ORDER — MONTELUKAST SODIUM 10 MG PO TABS
10.0000 mg | ORAL_TABLET | Freq: Every day | ORAL | 5 refills | Status: AC
  Filled 2023-06-28: qty 30, 30d supply, fill #0

## 2023-06-28 MED ORDER — PATADAY 0.2 % OP SOLN
1.0000 [drp] | Freq: Every day | OPHTHALMIC | 5 refills | Status: AC | PRN
  Filled 2023-06-28: qty 2.5, 50d supply, fill #0

## 2023-06-28 NOTE — Progress Notes (Signed)
 New Patient Note  RE: Frederick Tyler MRN: [id number] DOB: Jul 16, 1950 Date of Office Visit: 06/28/2023  Primary care provider: de [place], Buren Kos, MD  Chief Complaint: allergies  History of present illness: Frederick Tyler is a 73 y.o. male presenting today for evaluation of allergies to environmental factors.  Discussed the use of AI scribe software for clinical note transcription with the patient, who gave verbal consent to proceed.  He experiences persistent mucus accumulation in the back of his throat, leading to frequent throat clearing and attempts to cough it out. These symptoms have been particularly troublesome since moving to the area several years ago from [place].  He has a history of allergies, including grass and dust mites, for which he received allergy shots for two years in [place]. He is unsure if these treatments were effective. Since moving, he has experienced additional symptoms such as occasional nasal congestion, itchy and watery eyes, sneezing, and headaches localized above the eyebrows.  Currently, he uses Flonase nasal spray, administering a couple of puffs in the morning and as needed later in the day if symptoms persist. He finds this treatment helpful. He has also tried over-the-counter eye drops for his eye symptoms.  He has previously taken Claritin but reports it causes his prostate to swell, leading to discomfort. He associates similar symptoms with the use of decongestants like Sudafed, experiencing prostate pain within 12 to 14 hours of use.  He recalls having sinus infections a couple of years ago that required nebulizer treatments, but he does not experience these infections frequently. He performs saline nasal rinses with a neti pot as needed when symptoms intensify. He has not had a sinus CT scan yet, although it was ordered by the ENT specialist about a month ago.  He saw Dr.Soldatova where he had a normal-appearing nasal endoscopy.  No known food allergies.      Review of systems: 10pt ROS negative unless noted above in HPI  Past medical history: Past Medical History:  Diagnosis Date   Chronic kidney disease    GERD (gastroesophageal reflux disease)    Hypertension     Past surgical history: History reviewed. No pertinent surgical history.  Family history:  Family History  Problem Relation Age of Onset   Heart attack Mother    Eczema Father    Heart attack Father    Asthma Sister    Asthma Brother    Heart disease Brother    Heart disease Brother    Heart disease Brother     Social history: Lives in a home without carpeting with gas heating and central cooling.  No pets in the home.  There is no concern for water damage, mildew in the home.  There is concern for roaches in the home.  He does not report occupation at this time. Tobacco Use   Smoking status: Former    Current packs/day: 0.00    Types: Cigarettes    Quit date: 1996    Years since quitting: 29.2    Passive exposure: Past   Smokeless tobacco: Never  Vaping Use   Vaping status: Never Used     Medication List: Current Outpatient Medications  Medication Sig Dispense Refill   albuterol (VENTOLIN HFA) 108 (90 Base) MCG/ACT inhaler Inhale 1-2 puffs into the lungs every 6 (six) hours as needed for wheezing or shortness of breath. 8.5 g 0   aspirin 81 MG chewable tablet Chew 1 tablet (81 mg total) by mouth daily. 90 tablet 0  atenolol (TENORMIN) 100 MG tablet Take 1 tablet (100 mg total) by mouth 2 (two) times daily. 180 tablet 1   famotidine (PEPCID) 20 MG tablet Take 2 tablets (40 mg total) by mouth at bedtime. 180 tablet 0   fluticasone (FLONASE) 50 MCG/ACT nasal spray Place 2 sprays into both nostrils in the morning and at bedtime.     isosorbide mononitrate (IMDUR) 60 MG 24 hr tablet Take 1 tablet (60 mg total) by mouth daily. 90 tablet 1   losartan (COZAAR) 50 MG tablet Take 1 tablet (50 mg total) by mouth in the morning and at bedtime. 180 tablet 1    metFORMIN (GLUCOPHAGE) 500 MG tablet Take 1 tablet (500 mg total) by mouth 2 (two) times daily with a meal. 180 tablet 1   NIFEdipine (PROCARDIA XL/NIFEDICAL XL) 60 MG 24 hr tablet Take 1 tablet (60 mg total) by mouth in the morning and at bedtime. 180 tablet 0   nitroGLYCERIN (NITROSTAT) 0.4 MG SL tablet Place 1 tablet (0.4 mg total) under the tongue every 5 (five) minutes as needed for chest pain (max dose of 3. At initiation of 3rd dose call 911.). 30 tablet 1   Probiotic Product (PROBIOTIC BLEND PO) Take by mouth. NuZymes     Rivaroxaban (XARELTO) 15 MG TABS tablet Take 1 tablet (15 mg total) by mouth daily with supper. 90 tablet 1   rosuvastatin (CRESTOR) 20 MG tablet Take 1 tablet (20 mg total) by mouth daily. 90 tablet 1   No current facility-administered medications for this visit.    Known medication allergies: Allergies  Allergen Reactions   Grass Pollen(K-O-R-T-Swt Vern) Cough, Itching and Shortness Of Breath   Lipitor [Atorvastatin]     Liver issues    Lisinopril     Cough     Physical examination: Blood pressure 120/64, pulse 65, temperature 97.9 F (36.6 C), temperature source Temporal, resp. rate 16, height 5' 8.75" (1.746 m), weight 199 lb 8 oz (90.5 kg), SpO2 97%.  General: Alert, interactive, in no acute distress. HEENT: PERRLA, TMs pearly gray, turbinates mildly edematous without discharge, post-pharynx non erythematous. Neck: Supple without lymphadenopathy. Lungs: Clear to auscultation without wheezing, rhonchi or rales. {no increased work of breathing. CV: Normal S1, S2 without murmurs. Abdomen: Nondistended, nontender. Skin: Warm and dry, without lesions or rashes. Extremities:  No clubbing, cyanosis or edema. Neuro:   Grossly intact.  Diagnositics/Labs: None today  Assessment and plan: Allergic rhinitis with conjunctivitis Chronic allergic rhinitis with symptoms. Previous immunotherapy for grass and dust mites in [place].  Avoiding  antihistamine/decongestant based medications due to causing prostate issues. - Recommend starting Singulair (montelukast) 10mg  daily at bedtime to manage allergy symptoms without affecting the prostate. This is an antileukotriene that can help with both allergy and asthma symptom control.  If you notice any change in mood/behavior/sleep after starting Singulair then stop this medication and let us know.  Symptoms resolve after stopping the medication.  - Advise consistent use of saline nasal rinses two to three times a week to aid in sinus clearance and prevent mucus accumulation. - Continue Flonase 2 sprays each nostril daily for 1-2 weeks at a time before stopping once nasal congestion improves for maximum benefit - Use Pataday 1 drop each eye daily as needed for itchy/watery eyes - Order blood work to test for IgE levels to identify environmental allergens. - Discuss potential need for skin testing based on blood work results. - Contact ENT (Dr Leighton Roach office) about your sinus CT scan as  it has been ordered   Follow-up in 3-4 months or sooner if needed  No follow-ups on file.  I appreciate the opportunity to take part in Davidjames's care. Please do not hesitate to contact me with questions.  Sincerely,   Margo Aye, MD Allergy/Immunology [facility]

## 2023-06-28 NOTE — Patient Instructions (Signed)
 Allergic rhinitis with conjunctivitis Chronic allergic rhinitis with symptoms. Previous immunotherapy for grass and dust mites in [place].  Avoiding antihistamine/decongestant based medications due to causing prostate issues. - Recommend starting Singulair (montelukast) 10mg  daily at bedtime to manage allergy symptoms without affecting the prostate. This is an antileukotriene that can help with both allergy and asthma symptom control.  If you notice any change in mood/behavior/sleep after starting Singulair then stop this medication and let us know.  Symptoms resolve after stopping the medication.  - Advise consistent use of saline nasal rinses two to three times a week to aid in sinus clearance and prevent mucus accumulation. - Continue Flonase 2 sprays each nostril daily for 1-2 weeks at a time before stopping once nasal congestion improves for maximum benefit - Use Pataday 1 drop each eye daily as needed for itchy/watery eyes - Order blood work to test for IgE levels to identify environmental allergens. - Discuss potential need for skin testing based on blood work results. - Contact ENT (Dr Leighton Roach office) about your sinus CT scan as it has been ordered   Follow-up in 3-4 months or sooner if needed

## 2023-06-29 ENCOUNTER — Other Ambulatory Visit: Payer: Self-pay

## 2023-06-29 ENCOUNTER — Other Ambulatory Visit (HOSPITAL_BASED_OUTPATIENT_CLINIC_OR_DEPARTMENT_OTHER): Payer: Self-pay

## 2023-07-07 LAB — ALLERGENS W/TOTAL IGE AREA 2
Alternaria Alternata IgE: <0.1 kU/L
Aspergillus Fumigatus IgE: <0.1 kU/L
Bermuda Grass IgE: <0.1 kU/L
Cat Dander IgE: <0.1 kU/L
Cedar, Mountain IgE: <0.1 kU/L
Cladosporium Herbarum IgE: <0.1 kU/L
Cockroach, German IgE: 0.6 kU/L — AB
Common Silver Birch IgE: <0.1 kU/L
Cottonwood IgE: 0.14 kU/L — AB
D Farinae IgE: 0.16 kU/L — AB
D Pteronyssinus IgE: 0.31 kU/L — AB
Dog Dander IgE: <0.1 kU/L
Elm, American IgE: <0.1 kU/L
IgE (Immunoglobulin E), Serum: 2010 [IU]/mL — ABNORMAL HIGH (ref 6–495)
Johnson Grass IgE: 0.15 kU/L — AB
Maple/Box Elder IgE: <0.1 kU/L
Mouse Urine IgE: <0.1 kU/L
Oak, White IgE: 0.11 kU/L — AB
Pecan, Hickory IgE: <0.1 kU/L
Penicillium Chrysogen IgE: <0.1 kU/L
Pigweed, Rough IgE: <0.1 kU/L
Ragweed, Short IgE: <0.1 kU/L
Sheep Sorrel IgE Qn: <0.1 kU/L
Timothy Grass IgE: 0.44 kU/L — AB
White Mulberry IgE: <0.1 kU/L

## 2023-07-11 ENCOUNTER — Encounter: Payer: Self-pay | Admitting: Allergy

## 2023-07-26 ENCOUNTER — Other Ambulatory Visit (HOSPITAL_BASED_OUTPATIENT_CLINIC_OR_DEPARTMENT_OTHER): Payer: Self-pay

## 2023-07-26 ENCOUNTER — Other Ambulatory Visit (HOSPITAL_BASED_OUTPATIENT_CLINIC_OR_DEPARTMENT_OTHER): Payer: Self-pay | Admitting: Family Medicine

## 2023-07-26 ENCOUNTER — Other Ambulatory Visit: Payer: Self-pay

## 2023-07-26 MED ORDER — ISOSORBIDE MONONITRATE ER 60 MG PO TB24
60.0000 mg | ORAL_TABLET | Freq: Every day | ORAL | 1 refills | Status: DC
Start: 2023-07-26 — End: 2024-01-24
  Filled 2023-07-26: qty 90, 90d supply, fill #0
  Filled 2023-10-27: qty 90, 90d supply, fill #1

## 2023-07-26 MED ORDER — ROSUVASTATIN CALCIUM 20 MG PO TABS
20.0000 mg | ORAL_TABLET | Freq: Every day | ORAL | 1 refills | Status: DC
Start: 2023-07-26 — End: 2024-01-24
  Filled 2023-07-26: qty 90, 90d supply, fill #0
  Filled 2023-10-27: qty 90, 90d supply, fill #1

## 2023-08-02 ENCOUNTER — Other Ambulatory Visit (HOSPITAL_BASED_OUTPATIENT_CLINIC_OR_DEPARTMENT_OTHER): Payer: Self-pay

## 2023-08-02 ENCOUNTER — Other Ambulatory Visit (HOSPITAL_BASED_OUTPATIENT_CLINIC_OR_DEPARTMENT_OTHER): Payer: Self-pay | Admitting: Family Medicine

## 2023-08-02 DIAGNOSIS — K080 Exfoliation of teeth due to systemic causes: Secondary | ICD-10-CM | POA: Diagnosis not present

## 2023-08-02 MED ORDER — FAMOTIDINE 20 MG PO TABS
40.0000 mg | ORAL_TABLET | Freq: Every day | ORAL | 0 refills | Status: DC
  Filled 2023-08-02: qty 180, 90d supply, fill #0

## 2023-08-08 ENCOUNTER — Other Ambulatory Visit (HOSPITAL_BASED_OUTPATIENT_CLINIC_OR_DEPARTMENT_OTHER): Payer: Self-pay

## 2023-08-08 ENCOUNTER — Other Ambulatory Visit (HOSPITAL_BASED_OUTPATIENT_CLINIC_OR_DEPARTMENT_OTHER): Payer: Self-pay | Admitting: Family Medicine

## 2023-08-08 MED ORDER — RIVAROXABAN 15 MG PO TABS
15.0000 mg | ORAL_TABLET | Freq: Every day | ORAL | 1 refills | Status: DC
  Filled 2023-08-08 – 2024-03-14 (×2): qty 90, 90d supply, fill #0

## 2023-08-08 NOTE — Telephone Encounter (Signed)
 Copied from CRM [id number]. Topic: Clinical - Medication Refill >> Aug 08, 2023  2:33 PM [place] B wrote: Medication: Rivaroxaban  (XARELTO ) 15 MG TABS tablet  Has the patient contacted their pharmacy? yes (Agent: If yes, when and what did the pharmacy advise?)mail order pharmacy called directly in  This is the patient's preferred pharmacy:   [facility] - [place], [place] - [address] [facility] [place] [address] Phone: [phone] Fax: [phone]  Is this the correct pharmacy for this prescription? yes If no, delete pharmacy and type the correct one.   Has the prescription been filled recently? yes  Is the patient out of the medication? yes  Has the patient been seen for an appointment in the last year OR does the patient have an upcoming appointment? yes  Can we respond through MyChart? yes  Agent: Please be advised that Rx refills may take up to 3 business days. We ask that you follow-up with your pharmacy.

## 2023-08-10 ENCOUNTER — Ambulatory Visit: Payer: Self-pay

## 2023-08-10 NOTE — Telephone Encounter (Signed)
 Copied From CRM [id number]. Reason for Triage: Sore Throat, Painful to Swallow, Hurts when touching.   Chief Complaint: Sore throat,headache. No availability, asking to be worked in. Symptoms: above Frequency: 4 days Pertinent Negatives: Patient denies fever Disposition: [] ED /[] Urgent Care (no appt availability in office) / [] Appointment(In office/virtual)/ []  [facility] [facility]/ [] Home Care/ [] Refused Recommended Disposition /[] [place] [facility]/ [x]  Follow-up with PCP Additional Notes: Please advise pt.  Reason for Disposition  SEVERE (e.g., excruciating) throat pain  Answer Assessment - Initial Assessment Questions 1. ONSET: "When did the throat start hurting?" (Hours or days ago)      4 days 2. SEVERITY: "How bad is the sore throat?" (Scale 1-10; mild, moderate or severe)   - MILD (1-3):  Doesn't interfere with eating or normal activities.   - MODERATE (4-7): Interferes with eating some solids and normal activities.   - SEVERE (8-10):  Excruciating pain, interferes with most normal activities.   - SEVERE WITH DYSPHAGIA (10): Can't swallow liquids, drooling.     Moderate 3. STREP EXPOSURE: "Has there been any exposure to strep within the past week?" If Yes, ask: "What type of contact occurred?"      No 4.  VIRAL SYMPTOMS: "Are there any symptoms of a cold, such as a runny nose, cough, hoarse voice or red eyes?"      Runny nose 5. FEVER: "Do you have a fever?" If Yes, ask: "What is your temperature, how was it measured, and when did it start?"     No 6. PUS ON THE TONSILS: "Is there pus on the tonsils in the back of your throat?"     No 7. OTHER SYMPTOMS: "Do you have any other symptoms?" (e.g., difficulty breathing, headache, rash)     No 8. PREGNANCY: "Is there any chance you are pregnant?" "When was your last menstrual period?"     N/a  Protocols used: Sore Throat-A-AH

## 2023-08-13 ENCOUNTER — Ambulatory Visit: Payer: Self-pay

## 2023-08-13 NOTE — Telephone Encounter (Signed)
Appt made 5/20

## 2023-08-13 NOTE — Telephone Encounter (Signed)
 LVM for pt to call the office. If patient still having sore throat will need to make an appointment to be seen by a provider

## 2023-08-13 NOTE — Telephone Encounter (Signed)
 Copied from CRM [id number]. Topic: Clinical - Red Word Triage >> Aug 13, 2023  8:00 AM Leory Rands wrote: Red Word that prompted transfer to Nurse Triage: Patient wife is calling to report that the patient has a sore throat since last Sunday. Please advise   Chief Complaint: Sore Throat  Symptoms: Sore Throat  Frequency: Since Last Sunday  Pertinent Negatives: Patient denies chest pain, dyspnea, fever Disposition: [] ED /[] Urgent Care (no appt availability in office) / [] Appointment(In office/virtual)/ []  [facility]/ [] Home Care/ [] Refused Recommended Disposition /[] [place] [facility]/ []  Follow-up with PCP Additional Notes: Spoke with wife for triage, KA is being triaged for a sore throat. Reports that the patient is without any acute distress like symptoms. In office appointment made for tomorrow.   Reason for Disposition  Diabetes mellitus or weak immune system (e.g., HIV positive, cancer chemo, splenectomy, organ transplant, chronic steroids)  Answer Assessment - Initial Assessment Questions 1. ONSET: "When did the throat start hurting?" (Hours or days ago)      Since Last Sunday  2. SEVERITY: "How bad is the sore throat?" (Scale 1-10; mild, moderate or severe)   - MILD (1-3):  Doesn't interfere with eating or normal activities.   - MODERATE (4-7): Interferes with eating some solids and normal activities.   - SEVERE (8-10):  Excruciating pain, interferes with most normal activities.   - SEVERE WITH DYSPHAGIA (10): Can't swallow liquids, drooling.     Moderate  3. STREP EXPOSURE: "Has there been any exposure to strep within the past week?" If Yes, ask: "What type of contact occurred?"      Grandson was sick before.   4.  VIRAL SYMPTOMS: "Are there any symptoms of a cold, such as a runny nose, cough, hoarse voice or red eyes?"      Denies  5. FEVER: "Do you have a fever?" If Yes, ask: "What is your temperature, how was it measured, and when did it start?"      No  6. PUS ON THE TONSILS: "Is there pus on the tonsils in the back of your throat?"     Unsure, has not seen any to this point.   7. OTHER SYMPTOMS: "Do you have any other symptoms?" (e.g., difficulty breathing, headache, rash)     No  Protocols used: Sore Throat-A-AH

## 2023-08-14 ENCOUNTER — Encounter (HOSPITAL_BASED_OUTPATIENT_CLINIC_OR_DEPARTMENT_OTHER): Payer: Self-pay | Admitting: Family Medicine

## 2023-08-14 ENCOUNTER — Ambulatory Visit (INDEPENDENT_AMBULATORY_CARE_PROVIDER_SITE_OTHER): Admitting: Family Medicine

## 2023-08-14 ENCOUNTER — Other Ambulatory Visit (HOSPITAL_BASED_OUTPATIENT_CLINIC_OR_DEPARTMENT_OTHER): Payer: Self-pay

## 2023-08-14 VITALS — BP 136/62 | HR 65 | Ht 70.0 in | Wt 199.8 lb

## 2023-08-14 DIAGNOSIS — J0110 Acute frontal sinusitis, unspecified: Secondary | ICD-10-CM | POA: Diagnosis not present

## 2023-08-14 MED ORDER — AMOXICILLIN 875 MG PO TABS
875.0000 mg | ORAL_TABLET | Freq: Two times a day (BID) | ORAL | 0 refills | Status: AC
  Filled 2023-08-14: qty 14, 7d supply, fill #0

## 2023-08-14 NOTE — Patient Instructions (Signed)
  Medication Instructions:  Your physician recommends that you continue on your current medications as directed. Please refer to the Current Medication list given to you today. --If you need a refill on any your medications before your next appointment, please call your pharmacy first. If no refills are authorized on file call the office.-- Lab Work: Your physician has recommended that you have lab work today: no If you have labs (blood work) drawn today and your tests are completely normal, you will receive your results via MyChart message OR a phone call from our staff.  Please ensure you check your voicemail in the event that you authorized detailed messages to be left on a delegated number. If you have any lab test that is abnormal or we need to change your treatment, we will call you to review the results.  Referrals/Procedures/Imaging: no  Follow-Up: Your next appointment:   Your physician recommends that you schedule a follow-up appointment in: as needed with Dr. de [place]  You will receive a text message or e-mail with a link to a survey about your care and experience with [place] today! We would greatly appreciate your feedback!   Thanks for letting us be apart of your health journey!!  Primary Care and Sports Medicine   Dr. Ceasar Mons Peru   We encourage you to activate your patient portal called "MyChart".  Sign up information is provided on this After Visit Summary.  MyChart is used to connect with patients for Virtual Visits (Telemedicine).  Patients are able to view lab/test results, encounter notes, upcoming appointments, etc.  Non-urgent messages can be sent to your provider as well. To learn more about what you can do with MyChart, please visit --  [email or web address].

## 2023-08-14 NOTE — Progress Notes (Signed)
    Procedures performed today:    None.  Independent interpretation of notes and tests performed by another provider:   None.  Brief History, Exam, Impression, and Recommendations:    BP 136/62 (BP Location: Right Arm, Patient Position: Sitting, Cuff Size: Normal)   Pulse 65   Ht 5\' 10"  (1.778 m)   Wt 199 lb 12.8 oz (90.6 kg)   SpO2 96%   BMI 28.67 kg/m   Acute frontal sinusitis, recurrence not specified Assessment & Plan: Patient indicates that he began to have symptoms about 1 week ago of sore throat with progression and severe cough, sinus congestion.  Symptoms unfortunately have persisted for patient and continues to have sinus congestion, pressure, intermittent nasal discharge as well as postnasal drip.  He has had cough, denies any significant shortness of breath.  Denies any fever, chills, sweats. On exam, patient is in no acute distress, vital signs stable.  Cardiovascular exam with regular rate and rhythm, lungs clear to auscultation bilaterally.  Mild pharyngeal erythema.  No significant cervical lymphadenopathy. We discussed considerations.  Given symptoms and duration of illness, would be reasonable to proceed with antibiotic therapy at this time.  Discussed with patient, he is amenable.  He denies any allergies to penicillin-based antibiotics.  We will proceed with antibiotic therapy as below.  Instructed on completing course of antibiotics.  We also discussed potential risks and side effects related to antibiotic therapy.  Can continue with conservative measures in conjunction with antibiotics to help with controlling symptoms.   Other orders -     Amoxicillin ; Take 1 tablet (875 mg total) by mouth 2 (two) times daily for 7 days.  Dispense: 14 tablet; Refill: 0  Return if symptoms worsen or fail to improve.   ___________________________________________ Maryanne Huneycutt de [place], MD, ABFM, CAQSM Primary Care and Sports Medicine [facility]

## 2023-08-16 ENCOUNTER — Other Ambulatory Visit (HOSPITAL_BASED_OUTPATIENT_CLINIC_OR_DEPARTMENT_OTHER): Payer: Self-pay | Admitting: Family Medicine

## 2023-08-16 ENCOUNTER — Other Ambulatory Visit (HOSPITAL_BASED_OUTPATIENT_CLINIC_OR_DEPARTMENT_OTHER): Payer: Self-pay

## 2023-08-16 MED ORDER — NIFEDIPINE ER OSMOTIC RELEASE 60 MG PO TB24
60.0000 mg | ORAL_TABLET | Freq: Two times a day (BID) | ORAL | 3 refills | Status: AC
  Filled 2023-08-16: qty 180, 90d supply, fill #0
  Filled 2023-11-15: qty 180, 90d supply, fill #1
  Filled 2024-02-18: qty 180, 90d supply, fill #2

## 2023-08-16 NOTE — Assessment & Plan Note (Signed)
 Patient indicates that he began to have symptoms about 1 week ago of sore throat with progression and severe cough, sinus congestion.  Symptoms unfortunately have persisted for patient and continues to have sinus congestion, pressure, intermittent nasal discharge as well as postnasal drip.  He has had cough, denies any significant shortness of breath.  Denies any fever, chills, sweats. On exam, patient is in no acute distress, vital signs stable.  Cardiovascular exam with regular rate and rhythm, lungs clear to auscultation bilaterally.  Mild pharyngeal erythema.  No significant cervical lymphadenopathy. We discussed considerations.  Given symptoms and duration of illness, would be reasonable to proceed with antibiotic therapy at this time.  Discussed with patient, he is amenable.  He denies any allergies to penicillin-based antibiotics.  We will proceed with antibiotic therapy as below.  Instructed on completing course of antibiotics.  We also discussed potential risks and side effects related to antibiotic therapy.  Can continue with conservative measures in conjunction with antibiotics to help with controlling symptoms.

## 2023-08-23 ENCOUNTER — Telehealth (HOSPITAL_BASED_OUTPATIENT_CLINIC_OR_DEPARTMENT_OTHER): Payer: Self-pay | Admitting: Family Medicine

## 2023-08-23 ENCOUNTER — Telehealth (HOSPITAL_BASED_OUTPATIENT_CLINIC_OR_DEPARTMENT_OTHER): Payer: Self-pay

## 2023-08-23 DIAGNOSIS — I6523 Occlusion and stenosis of bilateral carotid arteries: Secondary | ICD-10-CM

## 2023-08-23 DIAGNOSIS — I2510 Atherosclerotic heart disease of native coronary artery without angina pectoris: Secondary | ICD-10-CM

## 2023-08-23 NOTE — Telephone Encounter (Signed)
-----   Message from Lawton K sent at 08/23/2023 11:22 AM EDT ----- Regarding: please update order This patient scheduled his carotid just now at [facility] on 6/13. Current order  by Neomi Banks, NP has expired. Please update

## 2023-08-23 NOTE — Telephone Encounter (Signed)
 Carotid scan order updated

## 2023-08-23 NOTE — Telephone Encounter (Signed)
 Copied from CRM [id number]. Topic: Clinical - Prescription Issue >> Aug 23, 2023  1:30 PM Hamp Levine R wrote: Reason for CRM: Patient called on 05/14 for a refill of Rivaroxaban  (XARELTO ) 15 MG TABS tablet. Is requesting it to be sent to a mail ordered pharmacy: [facility] - [place], [place] - [address] [place] [address] Phone: [phone] Fax: [phone]  Prescription shows sent to the incorrect pharmacy.  Patient for can be reached at [phone], would like a call back once corrected.

## 2023-08-24 MED ORDER — RIVAROXABAN 15 MG PO TABS: 15.0000 mg | ORAL_TABLET | Freq: Every day | ORAL | 1 refills | Status: AC

## 2023-08-27 NOTE — Telephone Encounter (Signed)
 LVM to let patient know refill sent to mail order pharmacy

## 2023-08-31 ENCOUNTER — Telehealth (INDEPENDENT_AMBULATORY_CARE_PROVIDER_SITE_OTHER): Payer: Self-pay | Admitting: Otolaryngology

## 2023-08-31 NOTE — Telephone Encounter (Signed)
 Tried to call patient to cancel 09/06/2023 appointment.  Was hung up on.  Patient has not had CT done or scheduled appt with Allergy.  Can reschedule appointment with [place] after the CT has been done and appointment with Allergy scheduled.

## 2023-09-06 ENCOUNTER — Ambulatory Visit (INDEPENDENT_AMBULATORY_CARE_PROVIDER_SITE_OTHER): Admitting: Otolaryngology

## 2023-09-07 ENCOUNTER — Ambulatory Visit (HOSPITAL_COMMUNITY)
Admission: RE | Admit: 2023-09-07 | Discharge: 2023-09-07 | Disposition: A | Discharge status: Home / Self Care | Source: Ambulatory Visit | Attending: Vascular Surgery

## 2023-09-07 DIAGNOSIS — I2510 Atherosclerotic heart disease of native coronary artery without angina pectoris: Secondary | ICD-10-CM | POA: Diagnosis not present

## 2023-09-07 DIAGNOSIS — I6523 Occlusion and stenosis of bilateral carotid arteries: Secondary | ICD-10-CM | POA: Diagnosis not present

## 2023-09-10 ENCOUNTER — Ambulatory Visit (HOSPITAL_BASED_OUTPATIENT_CLINIC_OR_DEPARTMENT_OTHER): Payer: Self-pay | Admitting: Family

## 2023-09-10 DIAGNOSIS — Z7982 Long term (current) use of aspirin: Secondary | ICD-10-CM | POA: Diagnosis not present

## 2023-09-10 DIAGNOSIS — D6869 Other thrombophilia: Secondary | ICD-10-CM | POA: Diagnosis not present

## 2023-09-10 DIAGNOSIS — I6523 Occlusion and stenosis of bilateral carotid arteries: Secondary | ICD-10-CM

## 2023-09-13 ENCOUNTER — Other Ambulatory Visit (HOSPITAL_BASED_OUTPATIENT_CLINIC_OR_DEPARTMENT_OTHER): Payer: Self-pay

## 2023-09-13 ENCOUNTER — Other Ambulatory Visit (HOSPITAL_BASED_OUTPATIENT_CLINIC_OR_DEPARTMENT_OTHER): Payer: Self-pay | Admitting: Family Medicine

## 2023-09-13 MED ORDER — ATENOLOL 100 MG PO TABS
100.0000 mg | ORAL_TABLET | Freq: Two times a day (BID) | ORAL | 1 refills | Status: DC
  Filled 2023-09-13: qty 180, 90d supply, fill #0
  Filled 2023-12-17: qty 180, 90d supply, fill #1

## 2023-10-11 ENCOUNTER — Ambulatory Visit: Admitting: Allergy

## 2023-10-16 ENCOUNTER — Encounter (HOSPITAL_BASED_OUTPATIENT_CLINIC_OR_DEPARTMENT_OTHER): Payer: Medicare Other

## 2023-10-16 ENCOUNTER — Ambulatory Visit (INDEPENDENT_AMBULATORY_CARE_PROVIDER_SITE_OTHER): Admitting: Otolaryngology

## 2023-10-16 ENCOUNTER — Other Ambulatory Visit (HOSPITAL_BASED_OUTPATIENT_CLINIC_OR_DEPARTMENT_OTHER): Payer: Self-pay

## 2023-10-16 ENCOUNTER — Telehealth (INDEPENDENT_AMBULATORY_CARE_PROVIDER_SITE_OTHER): Payer: Self-pay

## 2023-10-16 ENCOUNTER — Encounter (INDEPENDENT_AMBULATORY_CARE_PROVIDER_SITE_OTHER): Payer: Self-pay | Admitting: Otolaryngology

## 2023-10-16 VITALS — BP 155/71 | HR 66

## 2023-10-16 DIAGNOSIS — R519 Headache, unspecified: Secondary | ICD-10-CM

## 2023-10-16 DIAGNOSIS — R0982 Postnasal drip: Secondary | ICD-10-CM

## 2023-10-16 DIAGNOSIS — R0981 Nasal congestion: Secondary | ICD-10-CM

## 2023-10-16 DIAGNOSIS — J3089 Other allergic rhinitis: Secondary | ICD-10-CM

## 2023-10-16 DIAGNOSIS — J343 Hypertrophy of nasal turbinates: Secondary | ICD-10-CM

## 2023-10-16 DIAGNOSIS — J3489 Other specified disorders of nose and nasal sinuses: Secondary | ICD-10-CM

## 2023-10-16 DIAGNOSIS — J342 Deviated nasal septum: Secondary | ICD-10-CM

## 2023-10-16 MED ORDER — CETIRIZINE HCL 10 MG PO TABS
10.0000 mg | ORAL_TABLET | Freq: Every day | ORAL | 11 refills | Status: AC
  Filled 2023-10-16: qty 30, 30d supply, fill #0

## 2023-10-16 MED ORDER — FLUTICASONE PROPIONATE 50 MCG/ACT NA SUSP
2.0000 | Freq: Every day | NASAL | 5 refills | Status: AC
  Filled 2023-10-16: qty 16, 30d supply, fill #0

## 2023-10-16 NOTE — Telephone Encounter (Signed)
 Gave patient a call because he did not have his scan. I left a VM stating that he could still come but if he wanted to we could push his appt out until he gets his scan. I left our number if he wanted to give us  a call back. (Per Dr. GORMAN.)

## 2023-10-16 NOTE — Progress Notes (Signed)
 ENT Progress Note:   Update 10/16/2023  Discussed the use of AI scribe software for clinical note transcription with the patient, who gave verbal consent to proceed.  History of Present Illness Frederick Tyler is a 73 year old male who presents for f/u of chronic nasal congestion.  He has been experiencing ongoing headaches, described as a 'funny head' feeling in the morning, which are relieved by taking one Tylenol and resting for about half an hour. No significant sleep issues are reported, and he has a good quality of sleep.  He manages his nasal congestion with daily use of Zyrtec  and Flonase , administering a couple of puffs of Flonase  every day, which he finds effective. He also uses a saline spray in the morning, waits for about an hour, and then uses Flonase . Although a CT sinus scan was initially planned, it was denied by his insurance, [employer]. He feels his condition has improved and does not feel the need for the scan at this time.  He sometimes snores but does not report any significant issues with sleep that might contribute to his headaches.  Records Reviewed:  Initial Evaluation  Reason for Consult: chronic nasal congestion and hx of environmental allergies  throat clearing  HPI: Discussed the use of AI scribe software for clinical note transcription with the patient, who gave verbal consent to proceed.  History of Present Illness   The patient is a 60 yoM who presents with chronic nasal congestion and headaches/facial pressure/discomfort and sensation of phlegm and throat clearing.    He experiences chronic nasal congestion and headaches, which have worsened since moving from [place]  a few years ago. He describes a sensation of pressure on top of his eyes and intermittent headaches rated as 5 out of 10 in severity. Congestion is accompanied by mucus in his throat, particularly at night, sometimes requiring him to cough to clear it. No frequent sinus infections  requiring antibiotics.  He has a history of allergies to grasses and dust mites diagnosed in [place] , for which he received immunotherapy injections for two years, though he is unsure of their effectiveness. Since moving, he has not been allergy tested, and symptoms have worsened. He uses Flonase  nasal spray and saline for congestion but avoids oral antihistamines due to past side effects on his prostate (reports prostate enlargement after initiation of antihistamines).  He experiences occasional watery eyes associated with his headaches, which can occur on either side of his head. His headaches improve with Tylenol. No consistent productive cough.  He has a history of heartburn/reflux and takes omeprazole once daily. His eyes water after eating meals, which he associates with his nasal symptoms.  He has a history of diabetes managed with metformin  and a past light stroke for which he takes Xarelto . He quit smoking 20 years ago, which he believes improved his nasal symptoms. He has upper dentures.  [place], who attended law school in the area.   Records Reviewed:  PCP office visit 11/23/22  Type 2 diabetes mellitus with stage 3a chronic kidney disease, without long-term current use of insulin (HCC) Assessment & Plan: Recent hemoglobin A1c at goal at 6.4%.  Patient continues with metformin , denies any issues with medication.  He also continues with lifestyle modification.  Does have underlying stage IIIa CKD, proteinuria.  Currently taking losartan .  We did discuss potential role for SGLT2 inhibitor, discussed potential benefits related to kidney and heart protection, potential  risks associated with medication.  For now, patient will consider potential addition of this class of medication - recent labs did show evidence of severly increased albuminuria We will check hemoglobin A1c today    Past Medical History:  Diagnosis Date   Chronic kidney  disease    GERD (gastroesophageal reflux disease)    Hypertension     History reviewed. No pertinent surgical history.  Family History  Problem Relation Age of Onset   Heart attack Mother    Eczema Father    Heart attack Father    Asthma Sister    Asthma Brother    Heart disease Brother    Heart disease Brother    Heart disease Brother     Social History:  reports that he quit smoking about 29 years ago. His smoking use included cigarettes. He has been exposed to tobacco smoke. He has never used smokeless tobacco. He reports current alcohol use of about 2.0 standard drinks of alcohol per week. He reports that he does not use drugs.  Allergies:  Allergies  Allergen Reactions   Grass Pollen(K-O-R-T-Swt Vern) Cough, Itching and Shortness Of Breath   Lipitor [Atorvastatin]     Liver issues    Lisinopril     Cough    Medications: I have reviewed the patient's current medications.  The PMH, PSH, Medications, Allergies, and SH were reviewed and updated.  ROS: Constitutional: Negative for fever, weight loss and weight gain. Cardiovascular: Negative for chest pain and dyspnea on exertion. Respiratory: Is not experiencing shortness of breath at rest. Gastrointestinal: Negative for nausea and vomiting. Neurological: Negative for headaches. Psychiatric: The patient is not nervous/anxious  Blood pressure (!) 155/71, pulse 66, SpO2 95%. There is no height or weight on file to calculate BMI.  PHYSICAL EXAM:  Exam: General: Well-developed, well-nourished Respiratory Respiratory effort: Equal inspiration and expiration without stridor Cardiovascular Peripheral Vascular: Warm extremities with equal color/perfusion Eyes: No nystagmus with equal extraocular motion bilaterally Neuro/Psych/Balance: Patient oriented to person, place, and time; Appropriate mood and affect; Gait is intact with no imbalance; Cranial nerves I-XII are intact Head and Face Inspection: Normocephalic and  atraumatic without mass or lesion Palpation: Facial skeleton intact without bony stepoffs Salivary Glands: No mass or tenderness Facial Strength: Facial motility symmetric and full bilaterally ENT Pinna: External ear intact and fully developed External canal: Canal is patent with intact skin Tympanic Membrane: Clear and mobile External Nose: No scar or anatomic deformity Internal Nose: Septum is deviated to the left with narrowing of the left nasal passage. No polyp, or purulence. Mucosal edema and erythema present.  Bilateral inferior turbinate hypertrophy.  Lips, Teeth, and gums: Mucosa and teeth intact and viable TMJ: No pain to palpation with full mobility Oral cavity/oropharynx: No erythema or exudate, no lesions present Neck Neck and Trachea: Midline trachea without mass or lesion Thyroid: No mass or nodularity Lymphatics: No lymphadenopathy  Studies Reviewed: CXR 05/06/21 IMPRESSION: No active cardiopulmonary disease. Done for acute cough.  Assessment/Plan: Encounter Diagnoses  Name Primary?   Chronic nasal congestion Yes   Environmental and seasonal allergies    Post-nasal drip    Nasal obstruction    Nasal septal deviation    Hypertrophy of both inferior nasal turbinates      Assessment and Plan    Chronic Nasal Congestion Chronic nasal congestion with pressure on top of the eyes, headaches, and mucus sensation in the throat. Symptoms worsened after relocating from [place]  to the [place], indicating possible new environmental allergies. He was  previously on allergy shots in [place]  x 2 yrs. Nasal endoscopy showed septal deviation/ITH contributing to nasal congestion. Differential diagnosis includes environmental allergies & septal deviation/ITH likely causing some of his sx. Cannot rule out CRS as a cause of headaches, vs headaches/facial pressure not related to sinuses/headaches of other etiology. Medical management is the first line of treatment, with surgical  intervention to correct the septum/ITH as a second-line option if symptoms persist. - Refer to allergy specialist for retesting to identify potential new allergens in the current environment. - Order sinus CT imaging to evaluate for sinus-related headaches and rule out chronic sinusitis. - Continue Flonase  nasal spray, increase to two puffs per nostril twice daily. - Recommend saline nasal rinses with a NeilMed bottle to remove allergens from the nasal passages.  Gastroesophageal Reflux Disease (GERD) GERD hx and evidence of findings on flexible scope exam c/w GERD LPR likely contributing to sensation of phlegm and cough. Managed with omeprazole. Reflux Gourmet, a seaweed-based supplement, is recommended to coat stomach contents and prevent throat irritation. - Continue omeprazole once daily. - Recommend trial of Reflux Gourmet after meals  Chronic nasal congestion and post-nasal drainage Evidence of post-nasal drainage during scope exam today, could be contributing to her sx - consider trial of Zyrtec  10 mg daily if safe from prostate hypertrophy standpoint - Flonase  2 puffs b/l nares BID - consider nasal saline rinses   Septal Deviation and Inferior Turbinate Hypertrophy - medical management of nasal congestion as above  Hx of stroke on Xarelto   Follow-up Reassess after allergy testing and sinus imaging. - Schedule follow-up appointment after allergy testing and sinus imaging.     Update 10/16/2023 Assessment and Plan Assessment & Plan Chronic nasal congestion likely environmental allergies  Feels better on current regimen with Zyrtec  and Flonase . Symptoms improved. - Refill Flonase  and Zyrtec . - Recommend nasal saline rinses.  Headache Intermittent headaches in am, mild. Improve with Tylenol. Potential triggers: weather changes, possible sleep apnea. - Recommend Tylenol for relief. - Suggest Sudafed for weather-related headaches. - Discuss persistent headaches with primary  care doctor if unresolved. Consider sleep study    Elena Larry, MD Otolaryngology Gastroenterology [facility] Phone: [phone] Fax: [phone]    10/16/2023, 4:17 PM

## 2023-10-16 NOTE — Patient Instructions (Signed)
 Lloyd Huger Med Nasal Saline Rinse   - start nasal saline rinses with NeilMed Bottle available over the counter or online to help with nasal congestion

## 2023-10-18 ENCOUNTER — Encounter (HOSPITAL_BASED_OUTPATIENT_CLINIC_OR_DEPARTMENT_OTHER): Payer: Self-pay | Admitting: *Deleted

## 2023-10-23 ENCOUNTER — Encounter (HOSPITAL_BASED_OUTPATIENT_CLINIC_OR_DEPARTMENT_OTHER): Payer: Self-pay

## 2023-10-23 ENCOUNTER — Ambulatory Visit (HOSPITAL_BASED_OUTPATIENT_CLINIC_OR_DEPARTMENT_OTHER): Admitting: *Deleted

## 2023-10-23 VITALS — Ht 70.5 in | Wt 199.0 lb

## 2023-10-23 DIAGNOSIS — Z0000 Encounter for general adult medical examination without abnormal findings: Secondary | ICD-10-CM

## 2023-10-23 NOTE — Patient Instructions (Signed)
 [name] , Thank you for taking time to come for your Medicare Wellness Visit. I appreciate your ongoing commitment to your health goals. Please review the following plan we discussed and let me know if I can assist you in the future.   Screening recommendations/referrals: Colonoscopy: up to date Recommended yearly ophthalmology/optometry visit for glaucoma screening and checkup Recommended yearly dental visit for hygiene and checkup  Vaccinations: Influenza vaccine: up to date Pneumococcal vaccine: up to date Tdap vaccine: up to date Shingles vaccine: Education provided      Preventive Care 65 Years and Older, Male Preventive care refers to lifestyle choices and visits with your health care provider that can promote health and wellness. What does preventive care include? A yearly physical exam. This is also called an annual well check. Dental exams once or twice a year. Routine eye exams. Ask your health care provider how often you should have your eyes checked. Personal lifestyle choices, including: Daily care of your teeth and gums. Regular physical activity. Eating a healthy diet. Avoiding tobacco and drug use. Limiting alcohol use. Practicing safe sex. Taking low doses of aspirin  every day. Taking vitamin and mineral supplements as recommended by your health care provider. What happens during an annual well check? The services and screenings done by your health care provider during your annual well check will depend on your age, overall health, lifestyle risk factors, and family history of disease. Counseling  Your health care provider may ask you questions about your: Alcohol use. Tobacco use. Drug use. Emotional well-being. Home and relationship well-being. Sexual activity. Eating habits. History of falls. Memory and ability to understand (cognition). Work and work Astronomer. Screening  You may have the following tests or measurements: Height, weight, and  BMI. Blood pressure. Lipid and cholesterol levels. These may be checked every 5 years, or more frequently if you are over 51 years old. Skin check. Lung cancer screening. You may have this screening every year starting at age 85 if you have a 30-pack-year history of smoking and currently smoke or have quit within the past 15 years. Fecal occult blood test (FOBT) of the stool. You may have this test every year starting at age 54. Flexible sigmoidoscopy or colonoscopy. You may have a sigmoidoscopy every 5 years or a colonoscopy every 10 years starting at age 34. Prostate cancer screening. Recommendations will vary depending on your family history and other risks. Hepatitis C blood test. Hepatitis B blood test. Sexually transmitted disease (STD) testing. Diabetes screening. This is done by checking your blood sugar (glucose) after you have not eaten for a while (fasting). You may have this done every 1-3 years. Abdominal aortic aneurysm (AAA) screening. You may need this if you are a current or former smoker. Osteoporosis. You may be screened starting at age 91 if you are at high risk. Talk with your health care provider about your test results, treatment options, and if necessary, the need for more tests. Vaccines  Your health care provider may recommend certain vaccines, such as: Influenza vaccine. This is recommended every year. Tetanus, diphtheria, and acellular pertussis (Tdap, Td) vaccine. You may need a Td booster every 10 years. Zoster vaccine. You may need this after age 6. Pneumococcal 13-valent conjugate (PCV13) vaccine. One dose is recommended after age 60. Pneumococcal polysaccharide (PPSV23) vaccine. One dose is recommended after age 18. Talk to your health care provider about which screenings and vaccines you need and how often you need them. This information is not intended to  replace advice given to you by your health care provider. Make sure you discuss any questions you have  with your health care provider. Document Released: 04/09/2015 Document Revised: 12/01/2015 Document Reviewed: 01/12/2015 Elsevier Interactive Patient Education  2017 [employer].  Fall Prevention in the Home Falls can cause injuries. They can happen to people of all ages. There are many things you can do to make your home safe and to help prevent falls. What can I do on the outside of my home? Regularly fix the edges of walkways and driveways and fix any cracks. Remove anything that might make you trip as you walk through a door, such as a raised step or threshold. Trim any bushes or trees on the path to your home. Use bright outdoor lighting. Clear any walking paths of anything that might make someone trip, such as rocks or tools. Regularly check to see if handrails are loose or broken. Make sure that both sides of any steps have handrails. Any raised decks and porches should have guardrails on the edges. Have any leaves, snow, or ice cleared regularly. Use sand or salt on walking paths during winter. Clean up any spills in your garage right away. This includes oil or grease spills. What can I do in the bathroom? Use night lights. Install grab bars by the toilet and in the tub and shower. Do not use towel bars as grab bars. Use non-skid mats or decals in the tub or shower. If you need to sit down in the shower, use a plastic, non-slip stool. Keep the floor dry. Clean up any water that spills on the floor as soon as it happens. Remove soap buildup in the tub or shower regularly. Attach bath mats securely with double-sided non-slip rug tape. Do not have throw rugs and other things on the floor that can make you trip. What can I do in the bedroom? Use night lights. Make sure that you have a light by your bed that is easy to reach. Do not use any sheets or blankets that are too big for your bed. They should not hang down onto the floor. Have a firm chair that has side arms. You can use  this for support while you get dressed. Do not have throw rugs and other things on the floor that can make you trip. What can I do in the kitchen? Clean up any spills right away. Avoid walking on wet floors. Keep items that you use a lot in easy-to-reach places. If you need to reach something above you, use a strong step stool that has a grab bar. Keep electrical cords out of the way. Do not use floor polish or wax that makes floors slippery. If you must use wax, use non-skid floor wax. Do not have throw rugs and other things on the floor that can make you trip. What can I do with my stairs? Do not leave any items on the stairs. Make sure that there are handrails on both sides of the stairs and use them. Fix handrails that are broken or loose. Make sure that handrails are as long as the stairways. Check any carpeting to make sure that it is firmly attached to the stairs. Fix any carpet that is loose or worn. Avoid having throw rugs at the top or bottom of the stairs. If you do have throw rugs, attach them to the floor with carpet tape. Make sure that you have a light switch at the top of the stairs and the  bottom of the stairs. If you do not have them, ask someone to add them for you. What else can I do to help prevent falls? Wear shoes that: Do not have high heels. Have rubber bottoms. Are comfortable and fit you well. Are closed at the toe. Do not wear sandals. If you use a stepladder: Make sure that it is fully opened. Do not climb a closed stepladder. Make sure that both sides of the stepladder are locked into place. Ask someone to hold it for you, if possible. Clearly mark and make sure that you can see: Any grab bars or handrails. First and last steps. Where the edge of each step is. Use tools that help you move around (mobility aids) if they are needed. These include: Canes. Walkers. Scooters. Crutches. Turn on the lights when you go into a dark area. Replace any light bulbs  as soon as they burn out. Set up your furniture so you have a clear path. Avoid moving your furniture around. If any of your floors are uneven, fix them. If there are any pets around you, be aware of where they are. Review your medicines with your doctor. Some medicines can make you feel dizzy. This can increase your chance of falling. Ask your doctor what other things that you can do to help prevent falls. This information is not intended to replace advice given to you by your health care provider. Make sure you discuss any questions you have with your health care provider. Document Released: 01/07/2009 Document Revised: 08/19/2015 Document Reviewed: 04/17/2014 Elsevier Interactive Patient Education  2017 [employer].

## 2023-10-23 NOTE — Progress Notes (Signed)
 Subjective:   Frederick Tyler is a 73 y.o. male who presents for Medicare Annual/Subsequent preventive examination.  Visit Complete: Virtual I connected with  [name] on 10/23/23 by a audio enabled telemedicine application and verified that I am speaking with the correct person using two identifiers.  Patient Location: Home  Provider Location: Home Office  I discussed the limitations of evaluation and management by telemedicine. The patient expressed understanding and agreed to proceed.  Vital Signs: Because this visit was a virtual/telehealth visit, some criteria may be missing or patient reported. Any vitals not documented were not able to be obtained and vitals that have been documented are patient reported.   Cardiac Risk Factors include: advanced age (>45men, >43 women);male gender;obesity (BMI >30kg/m2)     Objective:    Today's Vitals   10/23/23 1440  Weight: 199 lb (90.3 kg)  Height: 5' 10.5 (1.791 m)   Body mass index is 28.15 kg/m.     10/23/2023    2:37 PM 10/10/2022    1:24 PM 12/31/2020   12:43 PM 07/13/2020    1:36 PM  Advanced Directives  Does Patient Have a Medical Advance Directive? Yes No Yes Yes  Type of Estate agent of [employer] Power of [place];Living will  Does patient want to make changes to medical advance directive?    No - Patient declined  Copy of Healthcare Power of Attorney in Chart? No - copy requested     Would patient like information on creating a medical advance directive?  No - Patient declined      Current Medications (verified) Outpatient Encounter Medications as of 10/23/2023  Medication Sig   albuterol  (VENTOLIN  HFA) 108 (90 Base) MCG/ACT inhaler Inhale 1-2 puffs into the lungs every 6 (six) hours as needed for wheezing or shortness of breath.   aspirin  81 MG chewable tablet Chew 1 tablet (81 mg total) by mouth daily.   atenolol  (TENORMIN ) 100 MG tablet Take 1 tablet (100 mg total) by mouth 2 (two)  times daily.   cetirizine  (ZYRTEC ) 10 MG tablet Take 1 tablet (10 mg total) by mouth daily.   famotidine  (PEPCID ) 20 MG tablet Take 2 tablets (40 mg total) by mouth at bedtime.   fluticasone  (FLONASE ) 50 MCG/ACT nasal spray Place 2 sprays into both nostrils in the morning and at bedtime.   fluticasone  (FLONASE ) 50 MCG/ACT nasal spray Place 2 sprays into both nostrils daily for 1-2 weeks at a time before stopping once nasal congestion improves for maximum benefit   isosorbide  mononitrate (IMDUR ) 60 MG 24 hr tablet Take 1 tablet (60 mg total) by mouth daily.   losartan  (COZAAR ) 50 MG tablet Take 1 tablet (50 mg total) by mouth in the morning and at bedtime.   metFORMIN  (GLUCOPHAGE ) 500 MG tablet Take 1 tablet (500 mg total) by mouth 2 (two) times daily with a meal.   montelukast  (SINGULAIR ) 10 MG tablet Take 1 tablet (10 mg total) by mouth at bedtime.   NIFEdipine  (PROCARDIA  XL/NIFEDICAL XL) 60 MG 24 hr tablet Take 1 tablet (60 mg total) by mouth in the morning and at bedtime.   nitroGLYCERIN  (NITROSTAT ) 0.4 MG SL tablet Place 1 tablet (0.4 mg total) under the tongue every 5 (five) minutes as needed for chest pain (max dose of 3. At initiation of 3rd dose call 911.).   PATADAY  0.2 % SOLN Place 1 drop into both eyes daily as needed.   Probiotic Product (PROBIOTIC BLEND PO) Take by mouth.  NuZymes   Rivaroxaban  (XARELTO ) 15 MG TABS tablet Take 1 tablet (15 mg total) by mouth daily with supper.   rosuvastatin  (CRESTOR ) 20 MG tablet Take 1 tablet (20 mg total) by mouth daily.   No facility-administered encounter medications on file as of 10/23/2023.    Allergies (verified) Grass pollen([id number]-swt vern), Lipitor [atorvastatin], and Lisinopril   History: Past Medical History:  Diagnosis Date   Chronic kidney disease    GERD (gastroesophageal reflux disease)    Hypertension    History reviewed. No pertinent surgical history. Family History  Problem Relation Age of Onset   Heart attack Mother     Eczema Father    Heart attack Father    Asthma Sister    Asthma Brother    Heart disease Brother    Heart disease Brother    Heart disease Brother    Social History   Socioeconomic History   Marital status: Married    Spouse name: Not on file   Number of children: 2   Years of education: Not on file   Highest education level: Bachelor's degree (e.g., BA, AB, BS)  Occupational History   Not on file  Tobacco Use   Smoking status: Former    Current packs/day: 0.00    Types: Cigarettes    Quit date: 1996    Years since quitting: 29.5    Passive exposure: Past   Smokeless tobacco: Never  Vaping Use   Vaping status: Never Used  Substance and Sexual Activity   Alcohol use: Yes    Alcohol/week: 2.0 standard drinks of alcohol    Types: 2 Shots of liquor per week    Comment: daily   Drug use: Never   Sexual activity: Yes    Birth control/protection: None  Other Topics Concern   Not on file  Social History Narrative   Not on file   Social Drivers of Health   Financial Resource Strain: Low Risk  (10/23/2023)   Overall Financial Resource Strain (CARDIA)    Difficulty of Paying Living Expenses: Not hard at all  Food Insecurity: No Food Insecurity (10/23/2023)   Hunger Vital Sign    Worried About Running Out of Food in the Last Year: Never true    Ran Out of Food in the Last Year: Never true  Transportation Needs: No Transportation Needs (10/23/2023)   PRAPARE - Administrator, Civil Service (Medical): No    Lack of Transportation (Non-Medical): No  Physical Activity: Sufficiently Active (10/23/2023)   Exercise Vital Sign    Days of Exercise per Week: 6 days    Minutes of Exercise per Session: 50 min  Stress: No Stress Concern Present (10/23/2023)   [employer] of Occupational Health - Occupational Stress Questionnaire    Feeling of Stress: Not at all  Social Connections: Socially Integrated (10/23/2023)   Social Connection and Isolation Panel     Frequency of Communication with Friends and Family: More than three times a week    Frequency of Social Gatherings with Friends and Family: More than three times a week    Attends Religious Services: More than 4 times per year    Active Member of [employer] or Organizations: Yes    Attends Engineer, structural: More than 4 times per year    Marital Status: Married    Tobacco Counseling Counseling given: Not Answered   Clinical Intake:  Pre-visit preparation completed: Yes  Pain : No/denies pain     Diabetes: No  How often do you need to have someone help you when you read instructions, pamphlets, or other written materials from your doctor or pharmacy?: 1 - Never  Interpreter Needed?: No  Information entered by :: Mliss Graff LPN   Activities of Daily Living    10/23/2023    2:37 PM  In your present state of health, do you have any difficulty performing the following activities:  Hearing? 0  Vision? 0  Difficulty concentrating or making decisions? 0  Walking or climbing stairs? 0  Dressing or bathing? 0  Doing errands, shopping? 0  Preparing Food and eating ? N  Using the Toilet? N  In the past six months, have you accidently leaked urine? N  Do you have problems with loss of bowel control? N  Managing your Medications? N  Managing your Finances? N    Patient Care Team: [place], Quintin PARAS, MD as PCP - General (Family Medicine) Lonni Slain, MD as PCP - Cardiology (Cardiology)  Indicate any recent Medical Services you may have received from other than [facility] providers in the past year (date may be approximate).     Assessment:   This is a routine wellness examination for [place].  Hearing/Vision screen Hearing Screening - Comments:: Not trouble hearing Vision Screening - Comments:: Up to date Unsure of Name   Goals Addressed               This Visit's Progress     Patient Stated (pt-stated)   On track     Enjoy life and stay healthy       Patient Stated        Stay healthy       Depression Screen    10/23/2023    2:38 PM 08/14/2023    3:42 PM 04/02/2023   10:22 AM 10/10/2022    1:22 PM 08/24/2022    1:15 PM 05/16/2022    3:27 PM 12/29/2021    3:01 PM  PHQ 2/9 Scores  PHQ - 2 Score 0 0 0 0 0 0 0  PHQ- 9 Score 0 0 0    0  Exception Documentation     Medical reason Medical reason Medical reason    Fall Risk    10/23/2023    2:38 PM 08/14/2023    3:42 PM 04/02/2023   10:21 AM 10/10/2022    1:26 PM 10/07/2022   12:20 PM  Fall Risk   Falls in the past year? 0 0 0 0 0  Number falls in past yr: 0 0 0 0 0  Injury with Fall? 0 0 0 0 0  Risk for fall due to :  No Fall Risks No Fall Risks No Fall Risks   Follow up Falls evaluation completed;Education provided;Falls prevention discussed Falls evaluation completed Falls evaluation completed Falls prevention discussed     MEDICARE RISK AT HOME: Medicare Risk at Home Any stairs in or around the home?: No If so, are there any without handrails?: No Home free of loose throw rugs in walkways, pet beds, electrical cords, etc?: Yes Adequate lighting in your home to reduce risk of falls?: Yes Life alert?: No Use of a cane, walker or w/c?: No Grab bars in the bathroom?: No Shower chair or bench in shower?: No Elevated toilet seat or a handicapped toilet?: No  TIMED UP AND GO:  Was the test performed?  No    Cognitive Function:        10/23/2023    2:54 PM 10/10/2022  1:27 PM 08/12/2021   11:49 AM  6CIT Screen  What Year? 0 points 0 points 0 points  What month? 0 points 0 points 0 points  What time? 0 points 0 points 0 points  Count back from 20 0 points 0 points 0 points  Months in reverse 0 points 0 points 0 points  Repeat phrase 0 points 0 points 2 points  Total Score 0 points 0 points 2 points    Immunizations Immunization History  Administered Date(s) Administered   Fluad Trivalent(High Dose 65+) 11/23/2022   Influenza, High Dose Seasonal PF 01/10/2019,  12/19/2019   Influenza, Quadrivalent, Recombinant, Inj, Pf 12/29/2021   Influenza, Seasonal, Injecte, Preservative Fre 01/12/2009, 12/30/2012, 01/19/2014, 01/07/2015, 01/28/2016   Influenza,inj,Quad PF,6+ Mos 01/08/2017, 01/23/2018   Moderna Sars-Covid-2 Vaccination 05/30/2019, 06/27/2019   PNEUMOCOCCAL CONJUGATE-20 11/23/2022   Pneumococcal Polysaccharide-23 07/27/2010, 01/30/2018   Td (Adult),5 Lf Tetanus Toxid, Preservative Free 03/28/2016   Tdap 10/27/2005   Zoster, Live 12/30/2012    TDAP status: Up to date  Flu Vaccine status: Up to date  Pneumococcal vaccine status: Up to date  Covid-19 vaccine status: Information provided on how to obtain vaccines.   Qualifies for Shingles Vaccine? Yes   Zostavax completed No   Shingrix Completed?: No.    Education has been provided regarding the importance of this vaccine. Patient has been advised to call insurance company to determine out of pocket expense if they have not yet received this vaccine. Advised may also receive vaccine at local pharmacy or Health Dept. Verbalized acceptance and understanding.  Screening Tests Health Maintenance  Topic Date Due   Hepatitis C Screening  Never done   Zoster Vaccines- Shingrix (1 of 2) 03/27/2000   COVID-19 Vaccine (3 - 2024-25 season) 11/26/2022   Diabetic kidney evaluation - eGFR measurement  05/17/2023   FOOT EXAM  05/17/2023   HEMOGLOBIN A1C  09/30/2023   INFLUENZA VACCINE  10/26/2023   Diabetic kidney evaluation - Urine ACR  04/01/2024   OPHTHALMOLOGY EXAM  05/01/2024   Medicare Annual Wellness (AWV)  10/22/2024   DTaP/Tdap/Td (3 - Td or Tdap) 03/28/2026   Colonoscopy  04/05/2030   Pneumococcal Vaccine: 50+ Years  Completed   Hepatitis B Vaccines  Aged Out   HPV VACCINES  Aged Out   Meningococcal B Vaccine  Aged Out    Health Maintenance  Health Maintenance Due  Topic Date Due   Hepatitis C Screening  Never done   Zoster Vaccines- Shingrix (1 of 2) 03/27/2000   COVID-19  Vaccine (3 - 2024-25 season) 11/26/2022   Diabetic kidney evaluation - eGFR measurement  05/17/2023   FOOT EXAM  05/17/2023   HEMOGLOBIN A1C  09/30/2023    Colorectal cancer screening: Type of screening: Colonoscopy. Completed 2022. Repeat every   years  Lung Cancer Screening: (Low Dose CT Chest recommended if Age 72-80 years, 20 pack-year currently smoking OR have quit w/in 15years.) does not qualify.   Lung Cancer Screening Referral:   Additional Screening:  Hepatitis C Screening never done  Vision Screening: Recommended annual ophthalmology exams for early detection of glaucoma and other disorders of the eye. Is the patient up to date with their annual eye exam?  Yes  Who is the provider or what is the name of the office in which the patient attends annual eye exams? Unsure of name If pt is not established with a provider, would they like to be referred to a provider to establish care? No .   Dental Screening:  Recommended annual dental exams for proper oral hygiene    Community Resource Referral / Chronic Care Management: CRR required this visit?  No   CCM required this visit?  No     Plan:     I have personally reviewed and noted the following in the patient's chart:   Medical and social history Use of alcohol, tobacco or illicit drugs  Current medications and supplements including opioid prescriptions. Patient is not currently taking opioid prescriptions. Functional ability and status Nutritional status Physical activity Advanced directives List of other physicians Hospitalizations, surgeries, and ER visits in previous 12 months Vitals Screenings to include cognitive, depression, and falls Referrals and appointments  In addition, I have reviewed and discussed with patient certain preventive protocols, quality metrics, and best practice recommendations. A written personalized care plan for preventive services as well as general preventive health recommendations  were provided to patient.     Mliss Graff, LPN   [id number]   After Visit Summary: (MyChart) Due to this being a telephonic visit, the after visit summary with patients personalized plan was offered to patient via MyChart   Nurse Notes:

## 2023-10-26 ENCOUNTER — Other Ambulatory Visit (HOSPITAL_BASED_OUTPATIENT_CLINIC_OR_DEPARTMENT_OTHER): Payer: Self-pay

## 2023-10-27 ENCOUNTER — Other Ambulatory Visit (HOSPITAL_BASED_OUTPATIENT_CLINIC_OR_DEPARTMENT_OTHER): Payer: Self-pay | Admitting: Family Medicine

## 2023-10-29 ENCOUNTER — Other Ambulatory Visit (HOSPITAL_BASED_OUTPATIENT_CLINIC_OR_DEPARTMENT_OTHER): Payer: Self-pay

## 2023-10-29 MED ORDER — FAMOTIDINE 20 MG PO TABS
40.0000 mg | ORAL_TABLET | Freq: Every day | ORAL | 0 refills | Status: DC
  Filled 2023-10-29: qty 180, 90d supply, fill #0

## 2023-11-15 ENCOUNTER — Other Ambulatory Visit (HOSPITAL_BASED_OUTPATIENT_CLINIC_OR_DEPARTMENT_OTHER): Payer: Self-pay | Admitting: Family Medicine

## 2023-11-15 ENCOUNTER — Other Ambulatory Visit (HOSPITAL_BASED_OUTPATIENT_CLINIC_OR_DEPARTMENT_OTHER): Payer: Self-pay

## 2023-11-15 ENCOUNTER — Other Ambulatory Visit: Payer: Self-pay

## 2023-11-15 DIAGNOSIS — I10 Essential (primary) hypertension: Secondary | ICD-10-CM

## 2023-11-15 MED ORDER — LOSARTAN POTASSIUM 50 MG PO TABS
50.0000 mg | ORAL_TABLET | Freq: Two times a day (BID) | ORAL | 1 refills | Status: AC
Start: 2023-11-15 — End: ?
  Filled 2023-11-15: qty 180, 90d supply, fill #0
  Filled 2024-02-18: qty 180, 90d supply, fill #1

## 2023-11-15 MED ORDER — METFORMIN HCL 500 MG PO TABS
500.0000 mg | ORAL_TABLET | Freq: Two times a day (BID) | ORAL | 1 refills | Status: AC
  Filled 2023-11-15: qty 180, 90d supply, fill #0
  Filled 2024-02-18: qty 180, 90d supply, fill #1

## 2023-11-23 ENCOUNTER — Ambulatory Visit: Payer: Self-pay

## 2023-11-23 NOTE — Telephone Encounter (Signed)
 FYI Only or Action Required?: FYI only for provider.  Patient was last seen in primary care on 08/14/2023 by de Peru, Quintin PARAS, MD.  Called Nurse Triage reporting Covid Exposure, Cough, and Sore Throat.  Symptoms began a week ago.  Interventions attempted: Prescription medications: tylenol and cough drops.  Symptoms are: rapidly improving.  Triage Disposition: Home Care  Patient/caregiver understands and will follow disposition?: Yes   Copied from CRM [id number]. Topic: Clinical - Red Word Triage >> Nov 23, 2023 10:37 AM Willma SAUNDERS wrote: Red Word that prompted transfer to Nurse Triage: Patient for the last 5-6 days has had a very bad sore throat, productive cough with discolored mucous. Had a fever but broke after the 3rd day. Wife tested positive for COVID. Reason for Disposition  Cough  Answer Assessment - Initial Assessment Questions 1. ONSET: When did the throat start hurting? (Hours or days ago)      About six days ago 2. SEVERITY: How bad is the sore throat? (Scale 1-10; mild, moderate or severe)     Denies any throat pain 3. STREP EXPOSURE: Has there been any exposure to strep within the past week? If Yes, ask: What type of contact occurred?      N/a 4.  VIRAL SYMPTOMS: Are there any symptoms of a cold, such as a runny nose, cough, hoarse voice or red eyes?      Runny nose earlier, but has resolved 5. FEVER: Do you have a fever? If Yes, ask: What is your temperature, how was it measured, and when did it start?     X 3 days, but resolved. Tmax 100 6. PUS ON THE TONSILS: Is there pus on the tonsils in the back of your throat?     unexamined 7. OTHER SYMPTOMS: Do you have any other symptoms? (e.g., difficulty breathing, headache, rash)     Denies any SOB at time of call, headaches have resolved 8. PREGNANCY: Is there any chance you are pregnant? When was your last menstrual period?     N/A  Answer Assessment - Initial Assessment Questions 1. ONSET:  When did the cough begin?      About six days ago 2. SEVERITY: How bad is the cough today?      mild 3. SPUTUM: Describe the color of your sputum (e.g., none, dry cough; clear, white, yellow, green)     green 4. HEMOPTYSIS: Are you coughing up any blood? If Yes, ask: How much? (e.g., flecks, streaks, tablespoons, etc.)     Denies  5. DIFFICULTY BREATHING: Are you having difficulty breathing? If Yes, ask: How bad is it? (e.g., mild, moderate, severe)      No SOB at time of call, did have SOB earlier in the week with cough 6. FEVER: Do you have a fever? If Yes, ask: What is your temperature, how was it measured, and when did it start?     T max 100 x 3 days, resolved 7. CARDIAC HISTORY: Do you have any history of heart disease? (e.g., heart attack, congestive heart failure)      HTN, palpitations 8. LUNG HISTORY: Do you have any history of lung disease?  (e.g., pulmonary embolus, asthma, emphysema)     History of pneumonia, former smoker 9. PE RISK FACTORS: Do you have a history of blood clots? (or: recent major surgery, recent prolonged travel, bedridden)     N/A 10. OTHER SYMPTOMS: Do you have any other symptoms? (e.g., runny nose, wheezing, chest pain)  Runny nose, resolved, sore throat resolved Denies wheezing 11. PREGNANCY: Is there any chance you are pregnant? When was your last menstrual period?       N/A 12. TRAVEL: Have you traveled out of the country in the last month? (e.g., travel history, exposures)       denies  Protocols used: Sore Throat-A-AH, Cough - Acute Productive-A-AH

## 2023-12-20 NOTE — Progress Notes (Addendum)
 Frederick Tyler                                          MRN: [id number]   12/20/2023   The [facility] Quality Team Specialist reviewed this patient medical record for the purposes of chart review for care gap closure. The following were reviewed: chart review for care gap closure-controlling blood pressure and kidney health evaluation for diabetes:eGFR  and uACR.    [facility] Quality Team

## 2023-12-26 ENCOUNTER — Encounter: Payer: Self-pay | Admitting: Cardiovascular Disease

## 2023-12-26 ENCOUNTER — Ambulatory Visit: Attending: Cardiovascular Disease | Admitting: Cardiovascular Disease

## 2023-12-26 ENCOUNTER — Other Ambulatory Visit: Payer: Self-pay

## 2023-12-26 ENCOUNTER — Other Ambulatory Visit (HOSPITAL_BASED_OUTPATIENT_CLINIC_OR_DEPARTMENT_OTHER): Payer: Self-pay

## 2023-12-26 VITALS — BP 136/64 | HR 63 | Resp 16 | Ht 70.0 in | Wt 201.0 lb

## 2023-12-26 DIAGNOSIS — I739 Peripheral vascular disease, unspecified: Secondary | ICD-10-CM | POA: Diagnosis not present

## 2023-12-26 DIAGNOSIS — I779 Disorder of arteries and arterioles, unspecified: Secondary | ICD-10-CM | POA: Insufficient documentation

## 2023-12-26 DIAGNOSIS — I6523 Occlusion and stenosis of bilateral carotid arteries: Secondary | ICD-10-CM | POA: Diagnosis not present

## 2023-12-26 MED ORDER — CILOSTAZOL 50 MG PO TABS
50.0000 mg | ORAL_TABLET | Freq: Two times a day (BID) | ORAL | 1 refills | Status: DC
  Filled 2023-12-26: qty 180, 90d supply, fill #0

## 2023-12-26 NOTE — Patient Instructions (Signed)
 Medication Instructions:  Your physician has recommended you make the following change in your medication:   -Start taking cilostazol (Pletal) 50mg  twice daily.  *If you need a refill on your cardiac medications before your next appointment, please call your pharmacy*  Testing/Procedures: Your physician has requested that you have a lower extremity arterial duplex. During this test, ultrasound is used to evaluate arterial blood flow in the legs. Allow one hour for this exam. There are no restrictions or special instructions. This will take place at [address] 4th floor  Please note: We ask at that you not bring children with you during ultrasound (echo/ vascular) testing. Due to room size and safety concerns, children are not allowed in the ultrasound rooms during exams. Our front office staff cannot provide observation of children in our lobby area while testing is being conducted. An adult accompanying a patient to their appointment will only be allowed in the ultrasound room at the discretion of the ultrasound technician under special circumstances. We apologize for any inconvenience.   Your physician has requested that you have an ankle brachial index (ABI). During this test an ultrasound and blood pressure cuff are used to evaluate the arteries that supply the arms and legs with blood. Allow thirty minutes for this exam. There are no restrictions or special instructions. This will take place at [address], 4th floor   Please note: We ask at that you not bring children with you during ultrasound (echo/ vascular) testing. Due to room size and safety concerns, children are not allowed in the ultrasound rooms during exams. Our front office staff cannot provide observation of children in our lobby area while testing is being conducted. An adult accompanying a patient to their appointment will only be allowed in the ultrasound room at the discretion of the ultrasound technician under  special circumstances. We apologize for any inconvenience.   Your physician has requested that you have a carotid duplex. This test is an ultrasound of the carotid arteries in your neck. It looks at blood flow through these arteries that supply the brain with blood. Allow one hour for this exam. There are no restrictions or special instructions. This will take place at [address] 4th floor **To do in June**  Please note: We ask at that you not bring children with you during ultrasound (echo/ vascular) testing. Due to room size and safety concerns, children are not allowed in the ultrasound rooms during exams. Our front office staff cannot provide observation of children in our lobby area while testing is being conducted. An adult accompanying a patient to their appointment will only be allowed in the ultrasound room at the discretion of the ultrasound technician under special circumstances. We apologize for any inconvenience.   Follow-Up: At [facility], you and your health needs are our priority.  As part of our continuing mission to provide you with exceptional heart care, our providers are all part of one team.  This team includes your primary Cardiologist (physician) and Advanced Practice Providers or APPs (Physician Assistants and Nurse Practitioners) who all work together to provide you with the care you need, when you need it.  Your next appointment:   3 month(s)  Provider:   Dorn Lesches, MD   We recommend signing up for the patient portal called MyChart.  Sign up information is provided on this After Visit Summary.  MyChart is used to connect with patients for Virtual Visits (Telemedicine).  Patients are able to view  lab/test results, encounter notes, upcoming appointments, etc.  Non-urgent messages can be sent to your provider as well.   To learn more about what you can do with MyChart, go to [email or web address].

## 2023-12-26 NOTE — Progress Notes (Signed)
 12/26/2023 Frederick Tyler   10-03-1950  [id number]  Primary Physician de Peru, Raymond J, MD Primary Cardiologist: Dorn [name], [name]  HPI:  Frederick Tyler is a 73 y.o.  mildly overweight married male father of 2, grandfather 2 grandchildren referred by Dr. Lonni, his cardiologist, for peripheral arterial disease evaluation.  I last saw him in the office 03/23/2023.  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.  He relocated from [facility] to [place] 2 years ago to be closer to family.  His risk factors include remote tobacco abuse, treated hypertension, diabetes and hyperlipidemia.  He has a strong family history of heart disease with both parents and 2 brothers all of whom have had an ischemic heart disease.  He has had 2 TIAs in the past currently on Xarelto .  He has a cardiac catheterization but no stents in the past.  He has CKD with creatinines that run in the mid 1 range.  He is noticed claudication for the last 7 to 8 years, which is symmetric but not necessarily lifestyle limiting.  He had Doppler studies performed 08/16/2022 revealing a right ABI of 0.58 and a left of 0.73.  He did have moderate disease in his right SFA as well as tibial vessels and an occluded left SFA.   Since I saw him in the office 10 months ago he remained stable.  He does however now complain of lifestyle-limiting claudication.  He complains of some left shoulder and arm pain as well.  He did have a negative cardiac PET study performed 08/02/2022.     Current Meds  Medication Sig   aspirin  81 MG chewable tablet Chew 1 tablet (81 mg total) by mouth daily.   atenolol  (TENORMIN ) 100 MG tablet Take 1 tablet (100 mg total) by mouth 2 (two) times daily.   cetirizine  (ZYRTEC ) 10 MG tablet Take 1 tablet (10 mg total) by mouth daily.   famotidine  (PEPCID ) 20 MG tablet Take 2 tablets (40 mg total) by mouth at bedtime.    fluticasone  (FLONASE ) 50 MCG/ACT nasal spray Place 2 sprays into both nostrils daily for 1-2 weeks at a time before stopping once nasal congestion improves for maximum benefit   isosorbide  mononitrate (IMDUR ) 60 MG 24 hr tablet Take 1 tablet (60 mg total) by mouth daily.   losartan  (COZAAR ) 50 MG tablet Take 1 tablet (50 mg total) by mouth in the morning and at bedtime.   metFORMIN  (GLUCOPHAGE ) 500 MG tablet Take 1 tablet (500 mg total) by mouth 2 (two) times daily with a meal.   NIFEdipine  (PROCARDIA  XL/NIFEDICAL XL) 60 MG 24 hr tablet Take 1 tablet (60 mg total) by mouth in the morning and at bedtime.   nitroGLYCERIN  (NITROSTAT ) 0.4 MG SL tablet Place 1 tablet (0.4 mg total) under the tongue every 5 (five) minutes as needed for chest pain (max dose of 3. At initiation of 3rd dose call 911.).   PATADAY  0.2 % SOLN Place 1 drop into both eyes daily as needed.   Probiotic Product (PROBIOTIC BLEND PO) Take by mouth. NuZymes   Rivaroxaban  (XARELTO ) 15 MG TABS tablet Take 1 tablet (15 mg total) by mouth daily with supper.   rosuvastatin  (CRESTOR ) 20 MG tablet Take 1 tablet (20 mg total) by mouth daily.     Allergies  Allergen Reactions   Grass Pollen(K-O-R-T-Swt Vern) Cough, Itching and Shortness Of Breath   Lipitor [Atorvastatin]  Liver issues    Lisinopril     Cough    Social History   Socioeconomic History   Marital status: Married    Spouse name: Not on file   Number of children: 2   Years of education: Not on file   Highest education level: Bachelor's degree (e.g., BA, AB, BS)  Occupational History   Not on file  Tobacco Use   Smoking status: Former    Current packs/day: 0.00    Types: Cigarettes    Quit date: 1996    Years since quitting: 29.7    Passive exposure: Past   Smokeless tobacco: Never  Vaping Use   Vaping status: Never Used  Substance and Sexual Activity   Alcohol use: Yes    Alcohol/week: 2.0 standard drinks of alcohol    Types: 2 Shots of liquor per week     Comment: daily   Drug use: Never   Sexual activity: Yes    Birth control/protection: None  Other Topics Concern   Not on file  Social History Narrative   Not on file   Social Drivers of Health   Financial Resource Strain: Low Risk  (10/23/2023)   Overall Financial Resource Strain (CARDIA)    Difficulty of Paying Living Expenses: Not hard at all  Food Insecurity: No Food Insecurity (10/23/2023)   Hunger Vital Sign    Worried About Running Out of Food in the Last Year: Never true    Ran Out of Food in the Last Year: Never true  Transportation Needs: No Transportation Needs (10/23/2023)   PRAPARE - Administrator, Civil Service (Medical): No    Lack of Transportation (Non-Medical): No  Physical Activity: Sufficiently Active (10/23/2023)   Exercise Vital Sign    Days of Exercise per Week: 6 days    Minutes of Exercise per Session: 50 min  Stress: No Stress Concern Present (10/23/2023)   [employer] of Occupational Health - Occupational Stress Questionnaire    Feeling of Stress: Not at all  Social Connections: Socially Integrated (10/23/2023)   Social Connection and Isolation Panel    Frequency of Communication with Friends and Family: More than three times a week    Frequency of Social Gatherings with Friends and Family: More than three times a week    Attends Religious Services: More than 4 times per year    Active Member of [employer] or Organizations: Yes    Attends Engineer, structural: More than 4 times per year    Marital Status: Married  Catering manager Violence: Not At Risk (10/23/2023)   Humiliation, Afraid, Rape, and Kick questionnaire    Fear of Current or Ex-Partner: No    Emotionally Abused: No    Physically Abused: No    Sexually Abused: No     Review of Systems: General: negative for chills, fever, night sweats or weight changes.  Cardiovascular: negative for chest pain, dyspnea on exertion, edema, orthopnea, palpitations, paroxysmal  nocturnal dyspnea or shortness of breath Dermatological: negative for rash Respiratory: negative for cough or wheezing Urologic: negative for hematuria Abdominal: negative for nausea, vomiting, diarrhea, bright red blood per rectum, melena, or hematemesis Neurologic: negative for visual changes, syncope, or dizziness All other systems reviewed and are otherwise negative except as noted above.    Blood pressure 136/64, pulse 63, resp. rate 16, height 5' 10 (1.778 m), weight 201 lb (91.2 kg), SpO2 97%.  General appearance: alert and no distress Neck: no adenopathy, no JVD, supple, symmetrical,  trachea midline, thyroid not enlarged, symmetric, no tenderness/mass/nodules, and bilateral carotid bruits Lungs: clear to auscultation bilaterally Heart: regular rate and rhythm, S1, S2 normal, no murmur, click, rub or gallop Extremities: extremities normal, atraumatic, no cyanosis or edema Pulses: Diminished pedal pulses Skin: Skin color, texture, turgor normal. No rashes or lesions Neurologic: Grossly normal  EKG EKG Interpretation Date/Time:  Wednesday December 26 2023 14:37:59 EDT Ventricular Rate:  62 PR Interval:  276 QRS Duration:  82 QT Interval:  394 QTC Calculation: 399 R Axis:   60  Text Interpretation: Sinus rhythm with sinus arrhythmia with 1st degree A-V block When compared with ECG of 02-Apr-2023 16:22, No significant change was found Confirmed by Court Carrier [phone]) on 12/26/2023 2:54:42 PM    ASSESSMENT AND PLAN:   Peripheral arterial disease History of peripheral arterial disease with lower extremity arterial Doppler studies performed 08/16/2022 revealing a right ABI of 0.58 and a left of 0.73.  He did have high-frequency signals in his right distal common femoral artery and SFA as well as an occluded posterior tibial artery.  His left SFA was occluded.  I had prescribed Pletal at that time which he did not end up taking.  When I saw him a year ago he really did not  complain of lifestyle-limiting claudication however in the last several months he has noticed some pain in his calfs when he walks, improved with rest.  I am going to repeat his lower extremity arterial Doppler studies, begin him on Pletal 50 mg p.o. twice daily and we will see him back in 3 months for follow-up.  Carotid artery disease Moderate bilateral ICA stenosis by duplex ultrasound performed 09/07/2023.  This will be repeated on an annual basis.     Carrier DOROTHA Court MD FACP,FACC,FAHA, [facility] 12/26/2023 3:10 PM

## 2023-12-26 NOTE — Assessment & Plan Note (Signed)
 Moderate bilateral ICA stenosis by duplex ultrasound performed 09/07/2023.  This will be repeated on an annual basis.

## 2023-12-26 NOTE — Assessment & Plan Note (Signed)
 History of peripheral arterial disease with lower extremity arterial Doppler studies performed 08/16/2022 revealing a right ABI of 0.58 and a left of 0.73.  He did have high-frequency signals in his right distal common femoral artery and SFA as well as an occluded posterior tibial artery.  His left SFA was occluded.  I had prescribed Pletal at that time which he did not end up taking.  When I saw him a year ago he really did not complain of lifestyle-limiting claudication however in the last several months he has noticed some pain in his calfs when he walks, improved with rest.  I am going to repeat his lower extremity arterial Doppler studies, begin him on Pletal 50 mg p.o. twice daily and we will see him back in 3 months for follow-up.

## 2024-01-14 ENCOUNTER — Ambulatory Visit: Payer: Self-pay | Admitting: Cardiovascular Disease

## 2024-01-14 ENCOUNTER — Ambulatory Visit (HOSPITAL_COMMUNITY)
Admission: RE | Admit: 2024-01-14 | Discharge: 2024-01-14 | Disposition: A | Discharge status: Home / Self Care | Source: Ambulatory Visit | Attending: Cardiovascular Disease | Admitting: Cardiovascular Disease

## 2024-01-14 ENCOUNTER — Telehealth: Payer: Self-pay | Admitting: Cardiovascular Disease

## 2024-01-14 DIAGNOSIS — I739 Peripheral vascular disease, unspecified: Secondary | ICD-10-CM | POA: Insufficient documentation

## 2024-01-14 NOTE — Telephone Encounter (Signed)
 Pt c/o medication issue:  1. Name of Medication:   cilostazol (PLETAL) 50 MG tablet    2. How are you currently taking this medication (dosage and times per day)?   3. Are you having a reaction (difficulty breathing--STAT)? no  4. What is your medication issue? Patient states that this medication is causing him some issues. Wants to see what other options there are. Please advise

## 2024-01-14 NOTE — Telephone Encounter (Signed)
 Spoke with pt who stated he started having dizziness and diarrhea after starting this: Pt c/o medication issue:   1. Name of Medication:   cilostazol (PLETAL) 50 MG tablet      Pt states it feels like a vertigo feeling. Explained that I will send this to Dr. Court and his nurse to review and once we get recommendations, we will be sure to let him know. Pt verbalized understanding of plan.

## 2024-01-14 NOTE — Telephone Encounter (Signed)
 Spoke with pt and notified him of Dr. Ranee suggestion. Pt verbalized understanding. All questions if any were answered.

## 2024-01-15 LAB — VAS US ABI WITH/WO TBI
Left ABI: 0.6
Right ABI: 0.59

## 2024-01-21 ENCOUNTER — Ambulatory Visit (HOSPITAL_COMMUNITY)
Admission: RE | Admit: 2024-01-21 | Discharge: 2024-01-21 | Disposition: A | Discharge status: Home / Self Care | Source: Ambulatory Visit | Attending: Cardiovascular Disease | Admitting: Cardiovascular Disease

## 2024-01-21 DIAGNOSIS — I739 Peripheral vascular disease, unspecified: Secondary | ICD-10-CM | POA: Insufficient documentation

## 2024-01-24 ENCOUNTER — Other Ambulatory Visit (HOSPITAL_BASED_OUTPATIENT_CLINIC_OR_DEPARTMENT_OTHER): Payer: Self-pay | Admitting: Family Medicine

## 2024-01-24 ENCOUNTER — Other Ambulatory Visit (HOSPITAL_BASED_OUTPATIENT_CLINIC_OR_DEPARTMENT_OTHER): Payer: Self-pay

## 2024-01-24 MED ORDER — FAMOTIDINE 20 MG PO TABS
40.0000 mg | ORAL_TABLET | Freq: Every day | ORAL | 0 refills | Status: DC
  Filled 2024-01-24: qty 180, 90d supply, fill #0

## 2024-01-24 MED ORDER — ROSUVASTATIN CALCIUM 20 MG PO TABS
20.0000 mg | ORAL_TABLET | Freq: Every day | ORAL | 1 refills | Status: AC
  Filled 2024-01-24: qty 90, 90d supply, fill #0
  Filled 2024-04-15: qty 90, 90d supply, fill #1

## 2024-01-24 MED ORDER — ISOSORBIDE MONONITRATE ER 60 MG PO TB24
60.0000 mg | ORAL_TABLET | Freq: Every day | ORAL | 1 refills | Status: AC
  Filled 2024-01-24: qty 90, 90d supply, fill #0
  Filled 2024-04-15: qty 90, 90d supply, fill #1

## 2024-01-28 ENCOUNTER — Other Ambulatory Visit (HOSPITAL_BASED_OUTPATIENT_CLINIC_OR_DEPARTMENT_OTHER): Payer: Self-pay

## 2024-02-01 ENCOUNTER — Ambulatory Visit: Attending: Cardiology

## 2024-02-01 ENCOUNTER — Ambulatory Visit: Attending: Cardiovascular Disease | Admitting: Cardiovascular Disease

## 2024-02-01 ENCOUNTER — Encounter: Payer: Self-pay | Admitting: Cardiovascular Disease

## 2024-02-01 VITALS — BP 150/68 | HR 66 | Ht 70.0 in | Wt 205.0 lb

## 2024-02-01 DIAGNOSIS — I6523 Occlusion and stenosis of bilateral carotid arteries: Secondary | ICD-10-CM

## 2024-02-01 DIAGNOSIS — R002 Palpitations: Secondary | ICD-10-CM

## 2024-02-01 DIAGNOSIS — I739 Peripheral vascular disease, unspecified: Secondary | ICD-10-CM

## 2024-02-01 NOTE — Patient Instructions (Signed)
 Medication Instructions:  Your physician recommends that you continue on your current medications as directed. Please refer to the Current Medication list given to you today.  *If you need a refill on your cardiac medications before your next appointment, please call your pharmacy*  Lab Work: Your physician recommends that you return for lab work in: after 11/11 for BMET & CBC    If you have labs (blood work) drawn today and your tests are completely normal, you will receive your results only by: MyChart Message (if you have MyChart) OR A paper copy in the mail If you have any lab test that is abnormal or we need to change your treatment, we will call you to review the results.  Testing/Procedures: Your physician has requested that you have a lower extremity arterial duplex. During this test, ultrasound is used to evaluate arterial blood flow in the legs. Allow one hour for this exam. There are no restrictions or special instructions. This will take place at [address], 4th floor   **To do 1-2 weeks after your procedure (12/11)**  Please note: We ask at that you not bring children with you during ultrasound (echo/ vascular) testing. Due to room size and safety concerns, children are not allowed in the ultrasound rooms during exams. Our front office staff cannot provide observation of children in our lobby area while testing is being conducted. An adult accompanying a patient to their appointment will only be allowed in the ultrasound room at the discretion of the ultrasound technician under special circumstances. We apologize for any inconvenience.   Your physician has requested that you have an ankle brachial index (ABI). During this test an ultrasound and blood pressure cuff are used to evaluate the arteries that supply the arms and legs with blood. Allow thirty minutes for this exam. There are no restrictions or special instructions. This will take place at [address] 4th floor     **To do 1-2 weeks after your procedure (12/11)**   Please note: We ask at that you not bring children with you during ultrasound (echo/ vascular) testing. Due to room size and safety concerns, children are not allowed in the ultrasound rooms during exams. Our front office staff cannot provide observation of children in our lobby area while testing is being conducted. An adult accompanying a patient to their appointment will only be allowed in the ultrasound room at the discretion of the ultrasound technician under special circumstances. We apologize for any inconvenience.   ZIO XT- Long Term Monitor Instructions  Your physician has requested you wear a ZIO patch monitor for 14 days.  This is a single patch monitor. [employer] supplies one patch monitor per enrollment. Additional stickers are not available. Please do not apply patch if you will be having a Nuclear Stress Test,  Echocardiogram, Cardiac CT, MRI, or Chest Xray during the period you would be wearing the  monitor. The patch cannot be worn during these tests. You cannot remove and re-apply the  ZIO XT patch monitor.  Your ZIO patch monitor will be mailed 3 day USPS to your address on file. It may take 3-5 days  to receive your monitor after you have been enrolled.  Once you have received your monitor, please review the enclosed instructions. Your monitor  has already been registered assigning a specific monitor serial # to you.  Billing and Patient Assistance Program Information  We have supplied [employer] with any of your insurance information on file for billing purposes. [employer]  offers a sliding scale Patient Assistance Program for patients that do not have  insurance, or whose insurance does not completely cover the cost of the ZIO monitor.  You must apply for the Patient Assistance Program to qualify for this discounted rate.  To apply, please call [employer] at [phone], select option 4, select option 2, ask to apply for   Patient Assistance Program. Meredeth will ask your household income, and how many people  are in your household. They will quote your out-of-pocket cost based on that information.  [employer] will also be able to set up a 39-month, interest-free payment plan if needed.  Applying the monitor   Shave hair from upper left chest.  Hold abrader disc by orange tab. Rub abrader in 40 strokes over the upper left chest as  indicated in your monitor instructions.  Clean area with 4 enclosed alcohol pads. Let dry.  Apply patch as indicated in monitor instructions. Patch will be placed under collarbone on left  side of chest with arrow pointing upward.  Rub patch adhesive wings for 2 minutes. Remove white label marked 1. Remove the white  label marked 2. Rub patch adhesive wings for 2 additional minutes.  While looking in a mirror, press and release button in center of patch. A small green light will  flash 3-4 times. This will be your only indicator that the monitor has been turned on.  Do not shower for the first 24 hours. You may shower after the first 24 hours.  Press the button if you feel a symptom. You will hear a small click. Record Date, Time and  Symptom in the Patient Logbook.  When you are ready to remove the patch, follow instructions on the last 2 pages of Patient  Logbook. Stick patch monitor onto the last page of Patient Logbook.  Place Patient Logbook in the blue and white box. Use locking tab on box and tape box closed  securely. The blue and white box has prepaid postage on it. Please place it in the mailbox as  soon as possible. Your physician should have your test results approximately 7 days after the  monitor has been mailed back to [facility].  Call [facility] Customer Care at [phone] if you have questions regarding  your ZIO XT patch monitor. Call them immediately if you see an orange light blinking on your  monitor.  If your monitor falls off in less than 4  days, contact our Monitor department at [phone].  If your monitor becomes loose or falls off after 4 days call [employer] at [phone] for  suggestions on securing your monitor   Follow-Up: At [facility], you and your health needs are our priority.  As part of our continuing mission to provide you with exceptional heart care, our providers are all part of one team.  This team includes your primary Cardiologist (physician) and Advanced Practice Providers or APPs (Physician Assistants and Nurse Practitioners) who all work together to provide you with the care you need, when you need it.  Your next appointment:   2-3 week(s) after your procedure (12/11)  Provider:   Dorn Lesches, MD    We recommend signing up for the patient portal called MyChart.  Sign up information is provided on this After Visit Summary.  MyChart is used to connect with patients for Virtual Visits (Telemedicine).  Patients are able to view lab/test results, encounter notes, upcoming appointments, etc.  Non-urgent messages can be sent to your provider as well.  To learn more about what you can do with MyChart, go to [email or web address].   Other Instructions       Cardiac/Peripheral Catheterization   You are scheduled for a Peripheral Angiogram on Thursday, December 11 with Dr. Dorn Lesches.  1. Please arrive at the [facility] (Main Entrance A) at [facility]: [address] [place], [place] [address] at 7:30 AM (This time is 2 hour(s) before your procedure to ensure your preparation).   Free valet parking service is available. You will check in at ADMITTING. The support person will be asked to wait in the waiting room.  It is OK to have someone drop you off and come back when you are ready to be discharged.        Special note: Every effort is made to have your procedure done on time. Please understand that emergencies sometimes delay scheduled procedures.  2. Diet: Nothing to  eat after midnight.  3. Hydration:You need to be well hydrated before your procedure. On December 11, you may drink approved liquids (see below) until 2 hours before the procedure, with 16 oz of water as your last intake.   List of approved liquids water, clear juice, clear tea, black coffee, fruit juices, non-citric and without pulp, carbonated beverages, Gatorade, Kool -Aid, plain Jello-O and plain ice popsicles.  4. Labs: You will need to have blood drawn after 11/11.    5. Medication instructions in preparation for your procedure:    Stop taking Xarelto  (Rivaroxaban ) on Monday, December 8.  Stop taking, Cozaar  (Losartan ) Thursday, December 11,  Do not take Diabetes Med Glucophage  (Metformin ) on the day of the procedure and HOLD 48 HOURS AFTER THE PROCEDURE.   On the morning of your procedure, take Aspirin  81 mg and any morning medicines NOT listed above.  You may use sips of water.  6. Plan to go home the same day, you will only stay overnight if medically necessary. 7. You MUST have a responsible adult to drive you home. 8. An adult MUST be with you the first 24 hours after you arrive home. 9. Bring a current list of your medications, and the last time and date medication taken. 10. Bring ID and current insurance cards. 11.Please wear clothes that are easy to get on and off and wear slip-on shoes.  Thank you for allowing us  to care for you!   -- [facility]

## 2024-02-01 NOTE — Assessment & Plan Note (Signed)
 History of moderate bilateral ICA stenosis by duplex ultrasound 09/07/2023.  This will need to be repeated on an annual basis.

## 2024-02-01 NOTE — Progress Notes (Signed)
 02/01/2024 Frederick Tyler   28-Mar-1950  [id number]  Primary Physician de [place], Raymond J, MD Primary Cardiologist: Dorn [name], [name]  HPI:  Frederick Tyler is a 73 y.o.  mildly overweight married male father of 2, grandfather 2 grandchildren referred by Dr. Lonni, his cardiologist, for peripheral arterial disease evaluation.  I last saw him in the office 12/26/2023.  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.  He relocated from [facility] to [place] 2 years ago to be closer to family.  His risk factors include remote tobacco abuse, treated hypertension, diabetes and hyperlipidemia.  He has a strong family history of heart disease with both parents and 2 brothers all of whom have had an ischemic heart disease.  He has had 2 TIAs in the past currently on Xarelto .  He has a cardiac catheterization but no stents in the past.  He has CKD with creatinines that run in the mid 1 range.  He is noticed claudication for the last 7 to 8 years, which is symmetric but not necessarily lifestyle limiting.  He had Doppler studies performed 08/16/2022 revealing a right ABI of 0.58 and a left of 0.73.  He did have moderate disease in his right SFA as well as tibial vessels and an occluded left SFA.   Since I saw him in the office 5 weeks ago I did empirically begin him on Pletal which she did not tolerate.  He still has lifestyle-limiting claudication.  He had lower extremity arterial Doppler studies performed 01/14/2024 revealing ABIs in the 0.6 range bilaterally with an occluded left SFA and high-grade right common femoral artery stenosis.  He wishes to proceed with outpatient peripheral angiography and potential intervention.  He does complain of palpitations in the morning occurring several times per week.  I am going to order a Zio patch to rule out PAF although he is already on Xarelto  for prior TIAs.   Current  Meds  Medication Sig   aspirin  81 MG chewable tablet Chew 1 tablet (81 mg total) by mouth daily.   atenolol  (TENORMIN ) 100 MG tablet Take 1 tablet (100 mg total) by mouth 2 (two) times daily.   cetirizine  (ZYRTEC ) 10 MG tablet Take 1 tablet (10 mg total) by mouth daily.   cilostazol (PLETAL) 50 MG tablet Take 1 tablet (50 mg total) by mouth 2 (two) times daily.   famotidine  (PEPCID ) 20 MG tablet Take 2 tablets (40 mg total) by mouth at bedtime.   fluticasone  (FLONASE ) 50 MCG/ACT nasal spray Place 2 sprays into both nostrils daily for 1-2 weeks at a time before stopping once nasal congestion improves for maximum benefit   isosorbide  mononitrate (IMDUR ) 60 MG 24 hr tablet Take 1 tablet (60 mg total) by mouth daily.   losartan  (COZAAR ) 50 MG tablet Take 1 tablet (50 mg total) by mouth in the morning and at bedtime.   metFORMIN  (GLUCOPHAGE ) 500 MG tablet Take 1 tablet (500 mg total) by mouth 2 (two) times daily with a meal.   montelukast  (SINGULAIR ) 10 MG tablet Take 1 tablet (10 mg total) by mouth at bedtime.   NIFEdipine  (PROCARDIA  XL/NIFEDICAL XL) 60 MG 24 hr tablet Take 1 tablet (60 mg total) by mouth in the morning and at bedtime.   nitroGLYCERIN  (NITROSTAT ) 0.4 MG SL tablet Place 1 tablet (0.4 mg total) under the tongue every 5 (five) minutes as needed for chest pain (max  dose of 3. At initiation of 3rd dose call 911.).   PATADAY  0.2 % SOLN Place 1 drop into both eyes daily as needed.   Probiotic Product (PROBIOTIC BLEND PO) Take by mouth. NuZymes   Rivaroxaban  (XARELTO ) 15 MG TABS tablet Take 1 tablet (15 mg total) by mouth daily with supper.   rosuvastatin  (CRESTOR ) 20 MG tablet Take 1 tablet (20 mg total) by mouth daily.     Allergies  Allergen Reactions   Grass Pollen(K-O-R-T-Swt Vern) Cough, Itching and Shortness Of Breath   Lipitor [Atorvastatin]     Liver issues    Lisinopril     Cough    Social History   Socioeconomic History   Marital status: Married    Spouse name: Not on  file   Number of children: 2   Years of education: Not on file   Highest education level: Bachelor's degree (e.g., BA, AB, BS)  Occupational History   Not on file  Tobacco Use   Smoking status: Former    Current packs/day: 0.00    Types: Cigarettes    Quit date: 1996    Years since quitting: 29.8    Passive exposure: Past   Smokeless tobacco: Never  Vaping Use   Vaping status: Never Used  Substance and Sexual Activity   Alcohol use: Yes    Alcohol/week: 2.0 standard drinks of alcohol    Types: 2 Shots of liquor per week    Comment: daily   Drug use: Never   Sexual activity: Yes    Birth control/protection: None  Other Topics Concern   Not on file  Social History Narrative   Not on file   Social Drivers of Health   Financial Resource Strain: Low Risk  (10/23/2023)   Overall Financial Resource Strain (CARDIA)    Difficulty of Paying Living Expenses: Not hard at all  Food Insecurity: No Food Insecurity (10/23/2023)   Hunger Vital Sign    Worried About Running Out of Food in the Last Year: Never true    Ran Out of Food in the Last Year: Never true  Transportation Needs: No Transportation Needs (10/23/2023)   PRAPARE - Administrator, Civil Service (Medical): No    Lack of Transportation (Non-Medical): No  Physical Activity: Sufficiently Active (10/23/2023)   Exercise Vital Sign    Days of Exercise per Week: 6 days    Minutes of Exercise per Session: 50 min  Stress: No Stress Concern Present (10/23/2023)   [employer] of Occupational Health - Occupational Stress Questionnaire    Feeling of Stress: Not at all  Social Connections: Socially Integrated (10/23/2023)   Social Connection and Isolation Panel    Frequency of Communication with Friends and Family: More than three times a week    Frequency of Social Gatherings with Friends and Family: More than three times a week    Attends Religious Services: More than 4 times per year    Active Member of [employer] or  Organizations: Yes    Attends Engineer, Structural: More than 4 times per year    Marital Status: Married  Catering Manager Violence: Not At Risk (10/23/2023)   Humiliation, Afraid, Rape, and Kick questionnaire    Fear of Current or Ex-Partner: No    Emotionally Abused: No    Physically Abused: No    Sexually Abused: No     Review of Systems: General: negative for chills, fever, night sweats or weight changes.  Cardiovascular: negative for chest pain,  dyspnea on exertion, edema, orthopnea, palpitations, paroxysmal nocturnal dyspnea or shortness of breath Dermatological: negative for rash Respiratory: negative for cough or wheezing Urologic: negative for hematuria Abdominal: negative for nausea, vomiting, diarrhea, bright red blood per rectum, melena, or hematemesis Neurologic: negative for visual changes, syncope, or dizziness All other systems reviewed and are otherwise negative except as noted above.    Blood pressure (!) 150/68, pulse 66, height 5' 10 (1.778 m), weight 205 lb (93 kg), SpO2 96%.  General appearance: alert and no distress Neck: no adenopathy, no carotid bruit, no JVD, supple, symmetrical, trachea midline, and thyroid not enlarged, symmetric, no tenderness/mass/nodules Lungs: clear to auscultation bilaterally Heart: regular rate and rhythm, S1, S2 normal, no murmur, click, rub or gallop Extremities: extremities normal, atraumatic, no cyanosis or edema Pulses: Absent pedal pulses Skin: Skin color, texture, turgor normal. No rashes or lesions Neurologic: Grossly normal  EKG not performed today      ASSESSMENT AND PLAN:   Carotid artery disease History of moderate bilateral ICA stenosis by duplex ultrasound 09/07/2023.  This will need to be repeated on an annual basis.  Peripheral arterial disease History of PAD status post peripheral vascular angiogram in [place] 10 years ago at which time he was offered bypass surgery but instead pursued exercise  therapy.  He had progressive claudication which is now lifestyle-limiting.  I did see him in the office a month ago and prescribed empiric Pletal which he did not tolerate.  His most recent Doppler studies performed 01/21/2024 revealed bilateral ABIs in the 0.6 range with an occluded left SFA and high-grade right common femoral artery stenosis.  I suspect he is a surgical candidate.  He wishes to proceed with outpatient diagnostic peripheral angiography to define his anatomy.     Dorn DOROTHA Lesches MD FACP,FACC,FAHA, [facility] 02/01/2024 3:43 PM

## 2024-02-01 NOTE — Assessment & Plan Note (Signed)
 History of PAD status post peripheral vascular angiogram in [place] 10 years ago at which time he was offered bypass surgery but instead pursued exercise therapy.  He had progressive claudication which is now lifestyle-limiting.  I did see him in the office a month ago and prescribed empiric Pletal which he did not tolerate.  His most recent Doppler studies performed 01/21/2024 revealed bilateral ABIs in the 0.6 range with an occluded left SFA and high-grade right common femoral artery stenosis.  I suspect he is a surgical candidate.  He wishes to proceed with outpatient diagnostic peripheral angiography to define his anatomy.

## 2024-02-01 NOTE — Progress Notes (Unsigned)
 Enrolled patient for a 14 day Zio XT  monitor to be mailed to patients home

## 2024-02-13 LAB — CBC
Hematocrit: 42.4 % (ref 37.5–51.0)
Hemoglobin: 13.7 g/dL (ref 13.0–17.7)
MCH: 29.6 pg (ref 26.6–33.0)
MCHC: 32.3 g/dL (ref 31.5–35.7)
MCV: 92 fL (ref 79–97)
Platelets: 164 x10E3/uL (ref 150–450)
RBC: 4.63 x10E6/uL (ref 4.14–5.80)
RDW: 12.9 % (ref 11.6–15.4)
WBC: 7.5 x10E3/uL (ref 3.4–10.8)

## 2024-02-13 LAB — BASIC METABOLIC PANEL WITH GFR
BUN/Creatinine Ratio: 11 (ref 10–24)
BUN: 17 mg/dL (ref 8–27)
CO2: 24 mmol/L (ref 20–29)
Calcium: 10.2 mg/dL (ref 8.6–10.2)
Chloride: 99 mmol/L (ref 96–106)
Creatinine, Ser: 1.5 mg/dL — ABNORMAL HIGH (ref 0.76–1.27)
Glucose: 173 mg/dL — ABNORMAL HIGH (ref 70–99)
Potassium: 4.5 mmol/L (ref 3.5–5.2)
Sodium: 138 mmol/L (ref 134–144)
eGFR: 49 mL/min/1.73 — ABNORMAL LOW (ref >59)

## 2024-02-14 ENCOUNTER — Ambulatory Visit: Payer: Self-pay | Admitting: Cardiovascular Disease

## 2024-02-27 ENCOUNTER — Other Ambulatory Visit: Payer: Self-pay | Admitting: Cardiovascular Disease

## 2024-02-27 DIAGNOSIS — I739 Peripheral vascular disease, unspecified: Secondary | ICD-10-CM

## 2024-02-29 DIAGNOSIS — R002 Palpitations: Secondary | ICD-10-CM

## 2024-03-03 NOTE — H&P (Signed)
 03/03/2024 Frederick Tyler   Aug 25, 1950  [id number]   Primary Physician de [place], Raymond J, MD Primary Cardiologist: Dorn [name], [name]   HPI:  Frederick Tyler is a 73 y.o.  mildly overweight married male father of 2, grandfather 2 grandchildren referred by Dr. Lonni, his cardiologist, for peripheral arterial disease evaluation.  I last saw him in the office 12/26/2023.  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.  He relocated from [facility] to [place] 2 years ago to be closer to family.  His risk factors include remote tobacco abuse, treated hypertension, diabetes and hyperlipidemia.  He has a strong family history of heart disease with both parents and 2 brothers all of whom have had an ischemic heart disease.  He has had 2 TIAs in the past currently on Xarelto .  He has a cardiac catheterization but no stents in the past.  He has CKD with creatinines that run in the mid 1 range.  He is noticed claudication for the last 7 to 8 years, which is symmetric but not necessarily lifestyle limiting.  He had Doppler studies performed 08/16/2022 revealing a right ABI of 0.58 and a left of 0.73.  He did have moderate disease in his right SFA as well as tibial vessels and an occluded left SFA.   Since I saw him in the office 5 weeks ago I did empirically begin him on Pletal  which she did not tolerate.  He still has lifestyle-limiting claudication.  He had lower extremity arterial Doppler studies performed 01/14/2024 revealing ABIs in the 0.6 range bilaterally with an occluded left SFA and high-grade right common femoral artery stenosis.  He wishes to proceed with outpatient peripheral angiography and potential intervention.  He does complain of palpitations in the morning occurring several times per week.  I am going to order a Zio patch to rule out PAF although he is already on Xarelto  for prior TIAs.      Active Medications      Current Meds  Medication Sig   aspirin  81 MG chewable tablet Chew 1 tablet (81 mg total) by mouth daily.   atenolol  (TENORMIN ) 100 MG tablet Take 1 tablet (100 mg total) by mouth 2 (two) times daily.   cetirizine  (ZYRTEC ) 10 MG tablet Take 1 tablet (10 mg total) by mouth daily.   cilostazol  (PLETAL ) 50 MG tablet Take 1 tablet (50 mg total) by mouth 2 (two) times daily.   famotidine  (PEPCID ) 20 MG tablet Take 2 tablets (40 mg total) by mouth at bedtime.   fluticasone  (FLONASE ) 50 MCG/ACT nasal spray Place 2 sprays into both nostrils daily for 1-2 weeks at a time before stopping once nasal congestion improves for maximum benefit   isosorbide  mononitrate (IMDUR ) 60 MG 24 hr tablet Take 1 tablet (60 mg total) by mouth daily.   losartan  (COZAAR ) 50 MG tablet Take 1 tablet (50 mg total) by mouth in the morning and at bedtime.   metFORMIN  (GLUCOPHAGE ) 500 MG tablet Take 1 tablet (500 mg total) by mouth 2 (two) times daily with a meal.   montelukast  (SINGULAIR ) 10 MG tablet Take 1 tablet (10 mg total) by mouth at bedtime.   NIFEdipine  (PROCARDIA  XL/NIFEDICAL XL) 60 MG 24 hr tablet Take 1 tablet (60 mg total) by mouth in the morning and at bedtime.   nitroGLYCERIN  (NITROSTAT ) 0.4 MG SL tablet Place 1 tablet (  0.4 mg total) under the tongue every 5 (five) minutes as needed for chest pain (max dose of 3. At initiation of 3rd dose call 911.).   PATADAY  0.2 % SOLN Place 1 drop into both eyes daily as needed.   Probiotic Product (PROBIOTIC BLEND PO) Take by mouth. NuZymes   Rivaroxaban  (XARELTO ) 15 MG TABS tablet Take 1 tablet (15 mg total) by mouth daily with supper.   rosuvastatin  (CRESTOR ) 20 MG tablet Take 1 tablet (20 mg total) by mouth daily.        Allergies       Allergies  Allergen Reactions   Grass Pollen(K-O-R-T-Swt Vern) Cough, Itching and Shortness Of Breath   Lipitor [Atorvastatin]        Liver issues    Lisinopril        Cough        Social History          Socioeconomic History   Marital status: Married      Spouse name: Not on file   Number of children: 2   Years of education: Not on file   Highest education level: Bachelor's degree (e.g., BA, AB, BS)  Occupational History   Not on file  Tobacco Use   Smoking status: Former      Current packs/day: 0.00      Types: Cigarettes      Quit date: 1996      Years since quitting: 29.8      Passive exposure: Past   Smokeless tobacco: Never  Vaping Use   Vaping status: Never Used  Substance and Sexual Activity   Alcohol use: Yes      Alcohol/week: 2.0 standard drinks of alcohol      Types: 2 Shots of liquor per week      Comment: daily   Drug use: Never   Sexual activity: Yes      Birth control/protection: None  Other Topics Concern   Not on file  Social History Narrative   Not on file    Social Drivers of Health        Financial Resource Strain: Low Risk  (10/23/2023)    Overall Financial Resource Strain (CARDIA)     Difficulty of Paying Living Expenses: Not hard at all  Food Insecurity: No Food Insecurity (10/23/2023)    Hunger Vital Sign     Worried About Running Out of Food in the Last Year: Never true     Ran Out of Food in the Last Year: Never true  Transportation Needs: No Transportation Needs (10/23/2023)    PRAPARE - Therapist, Art (Medical): No     Lack of Transportation (Non-Medical): No  Physical Activity: Sufficiently Active (10/23/2023)    Exercise Vital Sign     Days of Exercise per Week: 6 days     Minutes of Exercise per Session: 50 min  Stress: No Stress Concern Present (10/23/2023)    [employer] of Occupational Health - Occupational Stress Questionnaire     Feeling of Stress: Not at all  Social Connections: Socially Integrated (10/23/2023)    Social Connection and Isolation Panel     Frequency of Communication with Friends and Family: More than three times a week     Frequency of Social Gatherings with Friends and Family:  More than three times a week     Attends Religious Services: More than 4 times per year     Active Member of [employer] or Organizations: Yes  Attends Banker Meetings: More than 4 times per year     Marital Status: Married  Catering Manager Violence: Not At Risk (10/23/2023)    Humiliation, Afraid, Rape, and Kick questionnaire     Fear of Current or Ex-Partner: No     Emotionally Abused: No     Physically Abused: No     Sexually Abused: No      Review of Systems: General: negative for chills, fever, night sweats or weight changes.  Cardiovascular: negative for chest pain, dyspnea on exertion, edema, orthopnea, palpitations, paroxysmal nocturnal dyspnea or shortness of breath Dermatological: negative for rash Respiratory: negative for cough or wheezing Urologic: negative for hematuria Abdominal: negative for nausea, vomiting, diarrhea, bright red blood per rectum, melena, or hematemesis Neurologic: negative for visual changes, syncope, or dizziness All other systems reviewed and are otherwise negative except as noted above.       Blood pressure (!) 150/68, pulse 66, height 5' 10 (1.778 m), weight 205 lb (93 kg), SpO2 96%.  General appearance: alert and no distress Neck: no adenopathy, no carotid bruit, no JVD, supple, symmetrical, trachea midline, and thyroid not enlarged, symmetric, no tenderness/mass/nodules Lungs: clear to auscultation bilaterally Heart: regular rate and rhythm, S1, S2 normal, no murmur, click, rub or gallop Extremities: extremities normal, atraumatic, no cyanosis or edema Pulses: Absent pedal pulses Skin: Skin color, texture, turgor normal. No rashes or lesions Neurologic: Grossly normal   EKG not performed today       ASSESSMENT AND PLAN:    Carotid artery disease History of moderate bilateral ICA stenosis by duplex ultrasound 09/07/2023.  This will need to be repeated on an annual basis.   Peripheral arterial disease History of PAD status  post peripheral vascular angiogram in [place] 10 years ago at which time he was offered bypass surgery but instead pursued exercise therapy.  He had progressive claudication which is now lifestyle-limiting.  I did see him in the office a month ago and prescribed empiric Pletal  which he did not tolerate.  His most recent Doppler studies performed 01/21/2024 revealed bilateral ABIs in the 0.6 range with an occluded left SFA and high-grade right common femoral artery stenosis.  I suspect he is a surgical candidate.  He wishes to proceed with outpatient diagnostic peripheral angiography to define his anatomy.     Dorn DOROTHA Lesches, M.D., FACP, [facility], FAHA, [facility]  [address] [place], [place]  [address]  [phone] 03/03/2024 2:18 PM

## 2024-03-04 ENCOUNTER — Telehealth: Payer: Self-pay | Admitting: *Deleted

## 2024-03-04 NOTE — Telephone Encounter (Signed)
 LEA scheduled at [facility] for: Thursday March 06, 2024 9:30 AM Arrival time [facility] Main Entrance A at: 5:30 AM-pre-procedure hydration per Dr Court  Diet: -Nothing to eat after midnight.  Hydration: -May drink clear liquids until 2 hours before the procedure.  Approved liquids: Water, clear tea, black coffee, fruit juices-non-citric and without pulp,Gatorade, plain Jello/popsicles.  Medication instructions: -Hold:  Xarelto -none 03/04/24 until post procedure  Metformin -day of procedure and 48 hours after procedure   Losartan -day before and day of procedure-per protocol GFR < 60 (49) -Other usual morning medications can be taken including aspirin  81 mg.  Plan to go home the same day, you will only stay overnight if medically necessary.  You must have responsible adult to drive you home.  Someone must be with you the first 24 hours after you arrive home.  Reviewed procedure instructions/pre-procedure hydration with patient.

## 2024-03-06 ENCOUNTER — Ambulatory Visit (HOSPITAL_COMMUNITY)
Admission: RE | Admit: 2024-03-06 | Discharge: 2024-03-06 | Disposition: A | Discharge status: Home / Self Care | Attending: Cardiovascular Disease | Admitting: Cardiovascular Disease

## 2024-03-06 ENCOUNTER — Encounter (HOSPITAL_COMMUNITY): Admission: RE | Disposition: A | Payer: Self-pay | Attending: Cardiovascular Disease

## 2024-03-06 ENCOUNTER — Other Ambulatory Visit: Payer: Self-pay

## 2024-03-06 DIAGNOSIS — I739 Peripheral vascular disease, unspecified: Secondary | ICD-10-CM

## 2024-03-06 DIAGNOSIS — Z8249 Family history of ischemic heart disease and other diseases of the circulatory system: Secondary | ICD-10-CM | POA: Diagnosis not present

## 2024-03-06 DIAGNOSIS — E785 Hyperlipidemia, unspecified: Secondary | ICD-10-CM | POA: Diagnosis not present

## 2024-03-06 DIAGNOSIS — E1122 Type 2 diabetes mellitus with diabetic chronic kidney disease: Secondary | ICD-10-CM | POA: Diagnosis not present

## 2024-03-06 DIAGNOSIS — E1151 Type 2 diabetes mellitus with diabetic peripheral angiopathy without gangrene: Secondary | ICD-10-CM | POA: Diagnosis not present

## 2024-03-06 DIAGNOSIS — I6523 Occlusion and stenosis of bilateral carotid arteries: Secondary | ICD-10-CM | POA: Diagnosis not present

## 2024-03-06 DIAGNOSIS — N189 Chronic kidney disease, unspecified: Secondary | ICD-10-CM | POA: Diagnosis not present

## 2024-03-06 DIAGNOSIS — Z7984 Long term (current) use of oral hypoglycemic drugs: Secondary | ICD-10-CM | POA: Diagnosis not present

## 2024-03-06 DIAGNOSIS — I129 Hypertensive chronic kidney disease with stage 1 through stage 4 chronic kidney disease, or unspecified chronic kidney disease: Secondary | ICD-10-CM | POA: Diagnosis not present

## 2024-03-06 DIAGNOSIS — Z8673 Personal history of transient ischemic attack (TIA), and cerebral infarction without residual deficits: Secondary | ICD-10-CM | POA: Diagnosis not present

## 2024-03-06 DIAGNOSIS — Z7901 Long term (current) use of anticoagulants: Secondary | ICD-10-CM | POA: Diagnosis not present

## 2024-03-06 DIAGNOSIS — I70213 Atherosclerosis of native arteries of extremities with intermittent claudication, bilateral legs: Secondary | ICD-10-CM | POA: Diagnosis not present

## 2024-03-06 HISTORY — PX: LOWER EXTREMITY ANGIOGRAPHY: CATH118251

## 2024-03-06 MED ORDER — MIDAZOLAM HCL (PF) 2 MG/2ML IJ SOLN
INTRAMUSCULAR | Status: DC | PRN
  Administered 2024-03-06: 1 mg via INTRAVENOUS

## 2024-03-06 MED ORDER — FENTANYL CITRATE (PF) 100 MCG/2ML IJ SOLN
INTRAMUSCULAR | Status: DC | PRN
  Administered 2024-03-06: 25 ug via INTRAVENOUS

## 2024-03-06 MED ORDER — SODIUM CHLORIDE 0.9 % IV SOLN: INTRAVENOUS | Status: AC

## 2024-03-06 MED ORDER — IODIXANOL 320 MG/ML IV SOLN
INTRAVENOUS | Status: DC | PRN
  Administered 2024-03-06: 120 mL via INTRA_ARTERIAL

## 2024-03-06 MED ORDER — LIDOCAINE HCL (PF) 1 % IJ SOLN
INTRAMUSCULAR | Status: AC
  Filled 2024-03-06: qty 30

## 2024-03-06 MED ORDER — LIDOCAINE HCL (PF) 1 % IJ SOLN
INTRAMUSCULAR | Status: DC | PRN
  Administered 2024-03-06: 10 mL

## 2024-03-06 MED ORDER — ACETAMINOPHEN 325 MG PO TABS
ORAL_TABLET | ORAL | Status: AC
  Administered 2024-03-06: 650 mg via ORAL
  Filled 2024-03-06: qty 2

## 2024-03-06 MED ORDER — HYDRALAZINE HCL 20 MG/ML IJ SOLN: 5.0000 mg | INTRAMUSCULAR | Status: DC | PRN

## 2024-03-06 MED ORDER — SODIUM CHLORIDE 0.9 % WEIGHT BASED INFUSION
3.0000 mL/kg/h | INTRAVENOUS | Status: DC
  Administered 2024-03-06: 3 mL/kg/h via INTRAVENOUS

## 2024-03-06 MED ORDER — LABETALOL HCL 5 MG/ML IV SOLN: 10.0000 mg | INTRAVENOUS | Status: DC | PRN

## 2024-03-06 MED ORDER — ONDANSETRON HCL 4 MG/2ML IJ SOLN: 4.0000 mg | Freq: Four times a day (QID) | INTRAMUSCULAR | Status: DC | PRN

## 2024-03-06 MED ORDER — MIDAZOLAM HCL 2 MG/2ML IJ SOLN
INTRAMUSCULAR | Status: AC
  Filled 2024-03-06: qty 2

## 2024-03-06 MED ORDER — ACETAMINOPHEN 325 MG PO TABS: 650.0000 mg | ORAL_TABLET | ORAL | Status: DC | PRN

## 2024-03-06 MED ORDER — SODIUM CHLORIDE 0.9 % WEIGHT BASED INFUSION: 1.0000 mL/kg/h | INTRAVENOUS | Status: DC

## 2024-03-06 MED ORDER — MORPHINE SULFATE (PF) 2 MG/ML IV SOLN: 2.0000 mg | INTRAVENOUS | Status: DC | PRN

## 2024-03-06 MED ORDER — FENTANYL CITRATE (PF) 100 MCG/2ML IJ SOLN
INTRAMUSCULAR | Status: AC
  Filled 2024-03-06: qty 2

## 2024-03-06 NOTE — Progress Notes (Signed)
 Discharge instructions reviewed with patient and wife at the bedside. Denies questions or concerns. PT Tolerate PO intake, was able to ambulate without difficulty. Voided in the bathroom. Escorted from the unit via wheel chair to personal vehicle.

## 2024-03-06 NOTE — Interval H&P Note (Signed)
 History and Physical Interval Note:  03/06/2024 8:49 AM  Frederick Tyler  has presented today for surgery, with the diagnosis of pad.  The various methods of treatment have been discussed with the patient and family. After consideration of risks, benefits and other options for treatment, the patient has consented to  Procedures: Lower Extremity Angiography (N/A) as a surgical intervention.  The patient's history has been reviewed, patient examined, no change in status, stable for surgery.  I have reviewed the patient's chart and labs.  Questions were answered to the patient's satisfaction.     Dorn Lesches

## 2024-03-06 NOTE — Progress Notes (Signed)
 Site area: Left artery 5Fr Sheath Site Prior to Removal:  Level 0 Pressure Applied For: Manual:   yes Patient Status During Pull:  Stable Post Pull Site:  Level 0 Post Pull Instructions Given:  yes with teach back Post Pull Pulses Present: Doppler DP Dressing Applied:  gauze and Tegaderm Bedrest begins @ 1030

## 2024-03-06 NOTE — Discharge Instructions (Signed)
 Femoral Site Care This sheet gives you information about how to care for yourself after your procedure. Your health care provider may also give you more specific instructions. If you have problems or questions, contact your health care provider. What can I expect after the procedure?  After the procedure, it is common to have: Bruising that usually fades within 1-2 weeks. Tenderness at the site. Follow these instructions at home: Wound care Follow instructions from your health care provider about how to take care of your insertion site. Make sure you: Wash your hands with soap and water  before you change your bandage (dressing). If soap and water  are not available, use hand sanitizer. Remove your dressing as told by your health care provider. 24 hours Do not take baths, swim, or use a hot tub until your health care provider approves. You may shower 24-48 hours after the procedure or as told by your health care provider. Gently wash the site with plain soap and water . Pat the area dry with a clean towel. Do not rub the site. This may cause bleeding. Do not apply powder or lotion to the site. Keep the site clean and dry. Check your femoral site every day for signs of infection. Check for: Redness, swelling, or pain. Fluid or blood. Warmth. Pus or a bad smell. Activity For the first 2-3 days after your procedure, or as long as directed: Avoid climbing stairs as much as possible. Do not squat. Do not lift anything that is heavier than 10 lb (4.5 kg), or the limit that you are told, until your health care provider says that it is safe. For 5 days Rest as directed. Avoid sitting for a long time without moving. Get up to take short walks every 1-2 hours. Do not drive for 24 hours if you were given a medicine to help you relax (sedative). General instructions Take over-the-counter and prescription medicines only as told by your health care provider. Keep all follow-up visits as told by your  health care provider. This is important. Contact a health care provider if you have: A fever or chills. You have redness, swelling, or pain around your insertion site. Get help right away if: The catheter insertion area swells very fast. You pass out. You suddenly start to sweat or your skin gets clammy. The catheter insertion area is bleeding, and the bleeding does not stop when you hold steady pressure on the area. The area near or just beyond the catheter insertion site becomes pale, cool, tingly, or numb. These symptoms may represent a serious problem that is an emergency. Do not wait to see if the symptoms will go away. Get medical help right away. Call your local emergency services (911 in the [place] Do not drive yourself to the hospital. Summary After the procedure, it is common to have bruising that usually fades within 1-2 weeks. Check your femoral site every day for signs of infection. Do not lift anything that is heavier than 10 lb (4.5 kg), or the limit that you are told, until your health care provider says that it is safe. This information is not intended to replace advice given to you by your health care provider. Make sure you discuss any questions you have with your health care provider. Document Revised: 03/26/2017 Document Reviewed: 03/26/2017 Elsevier Patient Education  2020 [employer].

## 2024-03-07 ENCOUNTER — Encounter (HOSPITAL_COMMUNITY): Payer: Self-pay | Admitting: Cardiovascular Disease

## 2024-03-09 ENCOUNTER — Other Ambulatory Visit (HOSPITAL_BASED_OUTPATIENT_CLINIC_OR_DEPARTMENT_OTHER): Payer: Self-pay | Admitting: Family Medicine

## 2024-03-10 ENCOUNTER — Ambulatory Visit (HOSPITAL_COMMUNITY)

## 2024-03-10 ENCOUNTER — Other Ambulatory Visit (HOSPITAL_BASED_OUTPATIENT_CLINIC_OR_DEPARTMENT_OTHER): Payer: Self-pay

## 2024-03-10 MED ORDER — ATENOLOL 100 MG PO TABS
100.0000 mg | ORAL_TABLET | Freq: Two times a day (BID) | ORAL | 1 refills | Status: AC
  Filled 2024-03-10: qty 180, 90d supply, fill #0

## 2024-03-11 ENCOUNTER — Encounter: Payer: Self-pay | Admitting: Cardiovascular Disease

## 2024-03-11 ENCOUNTER — Ambulatory Visit: Attending: Cardiovascular Disease | Admitting: Cardiovascular Disease

## 2024-03-11 VITALS — BP 154/62 | HR 64 | Ht 70.0 in | Wt 199.0 lb

## 2024-03-11 DIAGNOSIS — R002 Palpitations: Secondary | ICD-10-CM | POA: Diagnosis not present

## 2024-03-11 DIAGNOSIS — I739 Peripheral vascular disease, unspecified: Secondary | ICD-10-CM | POA: Diagnosis not present

## 2024-03-11 DIAGNOSIS — I10 Essential (primary) hypertension: Secondary | ICD-10-CM | POA: Diagnosis not present

## 2024-03-11 DIAGNOSIS — E781 Pure hyperglyceridemia: Secondary | ICD-10-CM

## 2024-03-11 DIAGNOSIS — Z8673 Personal history of transient ischemic attack (TIA), and cerebral infarction without residual deficits: Secondary | ICD-10-CM

## 2024-03-11 NOTE — Progress Notes (Signed)
 03/11/2024 Frederick Tyler   November 25, 1950  [id number]  Primary Physician de [place], Raymond J, MD Primary Cardiologist: Dorn [name], [name]  HPI:  Frederick Tyler is a 73 y.o.   mildly overweight married male father of 2, grandfather 2 grandchildren referred by Dr. Lonni, his cardiologist, for peripheral arterial disease evaluation.  I last saw him in the office 03/03/2024.  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.  He relocated from [facility] to [place] 2 years ago to be closer to family.  His risk factors include remote tobacco abuse, treated hypertension, diabetes and hyperlipidemia.  He has a strong family history of heart disease with both parents and 2 brothers all of whom have had an ischemic heart disease.  He has had 2 TIAs in the past currently on Xarelto .  He has a cardiac catheterization but no stents in the past.  He has CKD with creatinines that run in the mid 1 range.  He is noticed claudication for the last 7 to 8 years, which is symmetric but not necessarily lifestyle limiting.  He had Doppler studies performed 08/16/2022 revealing a right ABI of 0.58 and a left of 0.73.  He did have moderate disease in his right SFA as well as tibial vessels and an occluded left SFA.   I did empirically begin him on Pletal  which she did not tolerate.  He still has lifestyle-limiting claudication.  He had lower extremity arterial Doppler studies performed 01/14/2024 revealing ABIs in the 0.6 range bilaterally with an occluded left SFA and high-grade right common femoral artery stenosis.  He wishes to proceed with outpatient peripheral angiography and potential intervention.  He does complain of palpitations in the morning occurring several times per week.  I am going to order a Zio patch to rule out PAF although he is already on Xarelto  for prior TIAs.  I performed peripheral angiography of him in  03/06/2024 revealing occluded left SFA with severe diffuse right SFA disease and two-vessel runoff bilaterally.  At this point, he does not wish to pursue surgical revascularization.   Active Medications[1]   Allergies[2]  Social History   Socioeconomic History   Marital status: Married    Spouse name: Not on file   Number of children: 2   Years of education: Not on file   Highest education level: Bachelor's degree (e.g., BA, AB, BS)  Occupational History   Not on file  Tobacco Use   Smoking status: Former    Current packs/day: 0.00    Types: Cigarettes    Quit date: 1996    Years since quitting: 29.9    Passive exposure: Past   Smokeless tobacco: Never  Vaping Use   Vaping status: Never Used  Substance and Sexual Activity   Alcohol use: Yes    Alcohol/week: 2.0 standard drinks of alcohol    Types: 2 Shots of liquor per week    Comment: daily   Drug use: Never   Sexual activity: Yes    Birth control/protection: None  Other Topics Concern   Not on file  Social History Narrative   Not on file   Social Drivers of Health   Tobacco Use: Medium Risk (02/01/2024)   Patient History    Smoking Tobacco Use: Former    Smokeless Tobacco Use: Never    Passive Exposure: Past  Physicist, Medical Strain: Low Risk (10/23/2023)   Overall Physicist, Medical  Strain (CARDIA)    Difficulty of Paying Living Expenses: Not hard at all  Food Insecurity: No Food Insecurity (10/23/2023)   Epic    Worried About Programme Researcher, Broadcasting/film/video in the Last Year: Never true    Ran Out of Food in the Last Year: Never true  Transportation Needs: No Transportation Needs (10/23/2023)   Epic    Lack of Transportation (Medical): No    Lack of Transportation (Non-Medical): No  Physical Activity: Sufficiently Active (10/23/2023)   Exercise Vital Sign    Days of Exercise per Week: 6 days    Minutes of Exercise per Session: 50 min  Stress: No Stress Concern Present (10/23/2023)   [employer] of Occupational  Health - Occupational Stress Questionnaire    Feeling of Stress: Not at all  Social Connections: Socially Integrated (10/23/2023)   Social Connection and Isolation Panel    Frequency of Communication with Friends and Family: More than three times a week    Frequency of Social Gatherings with Friends and Family: More than three times a week    Attends Religious Services: More than 4 times per year    Active Member of Clubs or Organizations: Yes    Attends Banker Meetings: More than 4 times per year    Marital Status: Married  Catering Manager Violence: Not At Risk (10/23/2023)   Epic    Fear of Current or Ex-Partner: No    Emotionally Abused: No    Physically Abused: No    Sexually Abused: No  Depression (PHQ2-9): Low Risk (10/23/2023)   Depression (PHQ2-9)    PHQ-2 Score: 0  Alcohol Screen: Medium Risk (10/23/2023)   Alcohol Screen    Last Alcohol Screening Score (AUDIT): 8  Housing: Unknown (10/23/2023)   Epic    Unable to Pay for Housing in the Last Year: No    Number of Times Moved in the Last Year: Not on file    Homeless in the Last Year: No  Utilities: Not At Risk (10/23/2023)   Epic    Threatened with loss of utilities: No  Health Literacy: Adequate Health Literacy (10/23/2023)   B1300 Health Literacy    Frequency of need for help with medical instructions: Never     Review of Systems: General: negative for chills, fever, night sweats or weight changes.  Cardiovascular: negative for chest pain, dyspnea on exertion, edema, orthopnea, palpitations, paroxysmal nocturnal dyspnea or shortness of breath Dermatological: negative for rash Respiratory: negative for cough or wheezing Urologic: negative for hematuria Abdominal: negative for nausea, vomiting, diarrhea, bright red blood per rectum, melena, or hematemesis Neurologic: negative for visual changes, syncope, or dizziness All other systems reviewed and are otherwise negative except as noted above.    Blood  pressure (!) 154/62, pulse 64, height 5' 10 (1.778 m), weight 199 lb (90.3 kg), SpO2 95%.  General appearance: alert and no distress Neck: no adenopathy, no JVD, supple, symmetrical, trachea midline, thyroid not enlarged, symmetric, no tenderness/mass/nodules, and soft left carotid bruit Lungs: clear to auscultation bilaterally Heart: regular rate and rhythm, S1, S2 normal, no murmur, click, rub or gallop Extremities: extremities normal, atraumatic, no cyanosis or edema Pulses: Absent pedal pulses Skin: Skin color, texture, turgor normal. No rashes or lesions Neurologic: Grossly normal  EKG not performed today      ASSESSMENT AND PLAN:   Hypertension History of essential hypertension blood pressure measured today at 154/62.  He says he checks his blood pressure at home and it runs in  the 135/70 range.  He is on atenolol  and losartan .  Peripheral arterial disease History of PAD with mild claudication, ABIs in the 0.6 range with recent peripheral angiography which I performed 03/06/2024 revealing an occluded left SFA at the origin reconstituting in the adductor canal with two-vessel runoff and severely and diffusely diseased right SFA with two-vessel runoff.  He did failed empiric treatment with Pletal .  He says at this point he does not feel he is limited enough to pursue vascular surgical intervention.  History of TIA (transient ischemic attack) History TIA in the past currently on Xarelto   Heart palpitations History of palpitations with a event monitor that showed 1% A-fib burden, short runs of SVT with occasional pauses up to 3.8 seconds in the early morning hours.  He is already on Xarelto .  Hypertriglyceridemia History of hyperlipidemia on rosuvastatin  with lipid profile performed 12/19/2021 revealing total cholesterol 112, LDL 34 and HDL of 42.  His triglyceride level was 236.  I am going to recheck a lipid and liver profile.  Carotid artery disease Carotid artery disease with  Dopplers performed 09/07/2023 that showed bilateral moderate ICA stenosis.  This to be repeated on an annual basis.     Dorn DOROTHA Lesches MD FACP,FACC,FAHA, FSCAI 03/11/2024 3:16 PM    [1]  Current Meds  Medication Sig   ascorbic acid (VITAMIN C) 500 MG tablet Take 500 mg by mouth in the morning and at bedtime.   aspirin  81 MG chewable tablet Chew 1 tablet (81 mg total) by mouth daily.   atenolol  (TENORMIN ) 100 MG tablet Take 1 tablet (100 mg total) by mouth 2 (two) times daily.   B Complex-C (B-COMPLEX WITH VITAMIN C) tablet Take 1 tablet by mouth daily.   cetirizine  (ZYRTEC ) 10 MG tablet Take 1 tablet (10 mg total) by mouth daily. (Patient taking differently: Take 10 mg by mouth at bedtime.)   Cholecalciferol (VITAMIN D3 PO) Take 1 tablet by mouth daily.   Cyanocobalamin (VITAMIN B-12) 5000 MCG TBDP Take 5,000 mcg by mouth daily.   Digestive Enzymes (DIGESTIVE ENZYME PO) Take 1 capsule by mouth in the morning and at bedtime. Nu-Zymes Plus Digestive Enzymes   famotidine  (PEPCID ) 20 MG tablet Take 2 tablets (40 mg total) by mouth at bedtime.   fluticasone  (FLONASE ) 50 MCG/ACT nasal spray Place 2 sprays into both nostrils daily for 1-2 weeks at a time before stopping once nasal congestion improves for maximum benefit (Patient taking differently: Place 2 sprays into both nostrils daily as needed for allergies.)   isosorbide  mononitrate (IMDUR ) 60 MG 24 hr tablet Take 1 tablet (60 mg total) by mouth daily.   KRILL OIL PO Take 500 mg by mouth daily.   losartan  (COZAAR ) 50 MG tablet Take 1 tablet (50 mg total) by mouth in the morning and at bedtime.   metFORMIN  (GLUCOPHAGE ) 500 MG tablet Take 1 tablet (500 mg total) by mouth 2 (two) times daily with a meal.   montelukast  (SINGULAIR ) 10 MG tablet Take 1 tablet (10 mg total) by mouth at bedtime.   NIFEdipine  (PROCARDIA  XL/NIFEDICAL XL) 60 MG 24 hr tablet Take 1 tablet (60 mg total) by mouth in the morning and at bedtime.   nitroGLYCERIN   (NITROSTAT ) 0.4 MG SL tablet Place 1 tablet (0.4 mg total) under the tongue every 5 (five) minutes as needed for chest pain (max dose of 3. At initiation of 3rd dose call 911.).   PATADAY  0.2 % SOLN Place 1 drop into both eyes daily as needed.  Rivaroxaban  (XARELTO ) 15 MG TABS tablet Take 1 tablet (15 mg total) by mouth daily with supper.   rosuvastatin  (CRESTOR ) 20 MG tablet Take 1 tablet (20 mg total) by mouth daily. (Patient taking differently: Take 20 mg by mouth every evening.)  [2]  Allergies Allergen Reactions   Grass Pollen(K-O-R-T-Swt Vern) Cough, Itching and Shortness Of Breath   Cilostazol  Other (See Comments)    severe headaches   Lipitor [Atorvastatin] Other (See Comments)    Liver issues    Lisinopril Cough

## 2024-03-11 NOTE — Patient Instructions (Signed)
 Medication Instructions:  Your physician recommends that you continue on your current medications as directed. Please refer to the Current Medication list given to you today.  *If you need a refill on your cardiac medications before your next appointment, please call your pharmacy*  Lab Work: Your physician recommends that you return for lab work in: the next week or 2 for FASTING lipid/liver panel  If you have labs (blood work) drawn today and your tests are completely normal, you will receive your results only by: MyChart Message (if you have MyChart) OR A paper copy in the mail If you have any lab test that is abnormal or we need to change your treatment, we will call you to review the results.  Testing/Procedures: Your physician has requested that you have a carotid duplex. This test is an ultrasound of the carotid arteries in your neck. It looks at blood flow through these arteries that supply the brain with blood. Allow one hour for this exam. There are no restrictions or special instructions. This will take place at [address] 4th floor **To do in June**  Please note: We ask at that you not bring children with you during ultrasound (echo/ vascular) testing. Due to room size and safety concerns, children are not allowed in the ultrasound rooms during exams. Our front office staff cannot provide observation of children in our lobby area while testing is being conducted. An adult accompanying a patient to their appointment will only be allowed in the ultrasound room at the discretion of the ultrasound technician under special circumstances. We apologize for any inconvenience.   Follow-Up: At [facility], you and your health needs are our priority.  As part of our continuing mission to provide you with exceptional heart care, our providers are all part of one team.  This team includes your primary Cardiologist (physician) and Advanced Practice Providers or APPs (Physician  Assistants and Nurse Practitioners) who all work together to provide you with the care you need, when you need it.  Your next appointment:   12 month(s)  Provider:   Dorn Lesches, MD   Please follow up with Dr. Lonni at next available.

## 2024-03-11 NOTE — Assessment & Plan Note (Signed)
 Carotid artery disease with Dopplers performed 09/07/2023 that showed bilateral moderate ICA stenosis.  This to be repeated on an annual basis.

## 2024-03-11 NOTE — Assessment & Plan Note (Signed)
 History of palpitations with a event monitor that showed 1% A-fib burden, short runs of SVT with occasional pauses up to 3.8 seconds in the early morning hours.  He is already on Xarelto .

## 2024-03-11 NOTE — Assessment & Plan Note (Signed)
 History of PAD with mild claudication, ABIs in the 0.6 range with recent peripheral angiography which I performed 03/06/2024 revealing an occluded left SFA at the origin reconstituting in the adductor canal with two-vessel runoff and severely and diffusely diseased right SFA with two-vessel runoff.  He did failed empiric treatment with Pletal .  He says at this point he does not feel he is limited enough to pursue vascular surgical intervention.

## 2024-03-11 NOTE — Assessment & Plan Note (Signed)
 History of essential hypertension blood pressure measured today at 154/62.  He says he checks his blood pressure at home and it runs in the 135/70 range.  He is on atenolol  and losartan .

## 2024-03-11 NOTE — Assessment & Plan Note (Signed)
 History TIA in the past currently on Xarelto 

## 2024-03-11 NOTE — Assessment & Plan Note (Signed)
 History of hyperlipidemia on rosuvastatin  with lipid profile performed 12/19/2021 revealing total cholesterol 112, LDL 34 and HDL of 42.  His triglyceride level was 236.  I am going to recheck a lipid and liver profile.

## 2024-03-12 ENCOUNTER — Telehealth: Payer: Self-pay

## 2024-03-12 NOTE — Telephone Encounter (Signed)
 Pt would like to switch from Dr. Lonni to Dr. Court. Thank you!

## 2024-03-18 NOTE — Telephone Encounter (Signed)
 Recall placed for pt to return in 1 year per Dr. Court.

## 2024-03-31 LAB — HEPATIC FUNCTION PANEL
ALT: 23 IU/L (ref 0–44)
AST: 21 IU/L (ref 0–40)
Albumin: 4.8 g/dL (ref 3.8–4.8)
Alkaline Phosphatase: 67 IU/L (ref 47–123)
Bilirubin Total: 0.3 mg/dL (ref 0.0–1.2)
Bilirubin, Direct: 0.12 mg/dL (ref 0.00–0.40)
Total Protein: 7.4 g/dL (ref 6.0–8.5)

## 2024-03-31 LAB — LIPID PANEL
Chol/HDL Ratio: 2.4 ratio (ref 0.0–5.0)
Cholesterol, Total: 121 mg/dL (ref 100–199)
HDL: 51 mg/dL
LDL Chol Calc (NIH): 37 mg/dL (ref 0–99)
Triglycerides: 208 mg/dL — ABNORMAL HIGH (ref 0–149)
VLDL Cholesterol Cal: 33 mg/dL (ref 5–40)

## 2024-04-01 ENCOUNTER — Ambulatory Visit: Payer: Self-pay | Admitting: Cardiovascular Disease

## 2024-04-15 ENCOUNTER — Other Ambulatory Visit: Payer: Self-pay

## 2024-04-15 ENCOUNTER — Other Ambulatory Visit (HOSPITAL_BASED_OUTPATIENT_CLINIC_OR_DEPARTMENT_OTHER): Payer: Self-pay

## 2024-04-15 ENCOUNTER — Other Ambulatory Visit (HOSPITAL_BASED_OUTPATIENT_CLINIC_OR_DEPARTMENT_OTHER): Payer: Self-pay | Admitting: Family Medicine

## 2024-04-15 MED ORDER — FAMOTIDINE 20 MG PO TABS
40.0000 mg | ORAL_TABLET | Freq: Every day | ORAL | 1 refills | Status: AC
  Filled 2024-04-15: qty 180, 90d supply, fill #0

## 2024-04-21 ENCOUNTER — Other Ambulatory Visit (HOSPITAL_BASED_OUTPATIENT_CLINIC_OR_DEPARTMENT_OTHER): Payer: Self-pay

## 2024-04-21 ENCOUNTER — Telehealth

## 2024-04-21 DIAGNOSIS — J028 Acute pharyngitis due to other specified organisms: Secondary | ICD-10-CM

## 2024-04-21 DIAGNOSIS — B9689 Other specified bacterial agents as the cause of diseases classified elsewhere: Secondary | ICD-10-CM

## 2024-04-21 MED ORDER — AMOXICILLIN-POT CLAVULANATE 875-125 MG PO TABS: 1.0000 | ORAL_TABLET | Freq: Two times a day (BID) | ORAL | 0 refills | Status: DC

## 2024-04-21 MED ORDER — AMOXICILLIN-POT CLAVULANATE 875-125 MG PO TABS
1.0000 | ORAL_TABLET | Freq: Two times a day (BID) | ORAL | 0 refills | Status: AC
  Filled 2024-04-21: qty 20, 10d supply, fill #0

## 2024-04-21 NOTE — Progress Notes (Signed)
 " Virtual Visit Consent   Frederick Tyler, you are scheduled for a virtual visit with a [facility] provider today. Just as with appointments in the office, your consent must be obtained to participate. Your consent will be active for this visit and any virtual visit you may have with one of our providers in the next 365 days. If you have a MyChart account, a copy of this consent can be sent to you electronically.  As this is a virtual visit, video technology does not allow for your provider to perform a traditional examination. This may limit your provider's ability to fully assess your condition. If your provider identifies any concerns that need to be evaluated in person or the need to arrange testing (such as labs, EKG, etc.), we will make arrangements to do so. Although advances in technology are sophisticated, we cannot ensure that it will always work on either your end or our end. If the connection with a video visit is poor, the visit may have to be switched to a telephone visit. With either a video or telephone visit, we are not always able to ensure that we have a secure connection.  By engaging in this virtual visit, you consent to the provision of healthcare and authorize for your insurance to be billed (if applicable) for the services provided during this visit. Depending on your insurance coverage, you may receive a charge related to this service.  I need to obtain your verbal consent now. Are you willing to proceed with your visit today? Frederick Tyler has provided verbal consent on 04/21/2024 for a virtual visit (video or telephone). Delon CHRISTELLA Dickinson, PA-C  Date: 04/21/2024 10:39 AM   Virtual Visit via Video Note   I, Delon CHRISTELLA Dickinson, connected with  Frederick Tyler  ([id number], [birth year]) on 04/21/24 at 10:15 AM EST by a video-enabled telemedicine application and verified that I am speaking with the correct person using two identifiers.  Location: Patient: Virtual Visit Location Patient:  Home Provider: Virtual Visit Location Provider: Home Office   I discussed the limitations of evaluation and management by telemedicine and the availability of in person appointments. The patient expressed understanding and agreed to proceed.    History of Present Illness: Frederick Tyler is a 74 y.o. who identifies as a male who was assigned male at birth, and is being seen today for sore throat.  HPI: Sore Throat  This is a new problem. The current episode started 1 to 4 weeks ago (over a week). The problem has been gradually worsening. The pain is worse on the right side. Maximum temperature: subjective. The pain is moderate. Associated symptoms include congestion, coughing, headaches, shortness of breath (with mild wheezing with lying down that cleared with cough), swollen glands and trouble swallowing. Pertinent negatives include no diarrhea, ear discharge, ear pain, hoarse voice, plugged ear sensation or vomiting. Associated symptoms comments: Nasal congestion, rhinorrhea with cloudy and thick mucus; Did have an episode yesterday where he took a generic Mucinex DM and felt like it became stuck in his throat for about an hour. Once that passed he did have increased irritation in his throat. That has subsided and he has been able to eat and drink, and he was able to also take Tylenol  last night without issue. He denies feeling like the medication is still there, nor does he feel like this medication went into his lungs at all.. He has tried acetaminophen  for the symptoms. The treatment provided no relief.     Problems:  Patient Active Problem List   Diagnosis Date Noted   Carotid artery disease 12/26/2023   Hypertriglyceridemia 04/02/2023   History of TIA (transient ischemic attack) 11/23/2022   Peripheral arterial disease 09/15/2022   Constipation 08/24/2022   Numbness and tingling in left arm 06/27/2022   Heart palpitations 05/26/2022   Prostate atrophy 05/11/2022   Urinary frequency  12/29/2021   Sinusitis 07/19/2021   Visual disturbance 02/03/2021   Chronic sinusitis 01/18/2021   Cough 12/31/2020   Acute non-recurrent pansinusitis 12/23/2020   Moderately increased albuminuria 11/23/2020   Chronic kidney disease 06/28/2020   Diabetes mellitus with chronic kidney disease (HCC) 06/28/2020   Hypertension 06/28/2020   Chronic low back pain 06/28/2020   Long term (current) use of anticoagulants 07/05/2019   Cerebral infarction, unspecified (HCC) 06/05/2019   Hardening of the aorta (main artery of the heart) 10/08/2016   Iron  deficiency anemia 06/07/2015   Bilateral inguinal hernia, without obstruction or gangrene, not specified as recurrent 10/23/2014   Personal history of urinary calculi 10/23/2014   Chronic kidney disease, stage 3a (HCC) 07/28/2014   Proteinuria 07/28/2014   Sensory problems with limbs 04/29/2012   Type 2 diabetes mellitus with diabetic polyneuropathy (HCC) 04/29/2012   Other problems related to lifestyle 08/02/2011   Psoriasis 10/27/2005   Cervicalgia 11/02/1998   Personal history of other diseases of the respiratory system 11/27/1995   GERD (gastroesophageal reflux disease) 08/28/1995   Tobacco use disorder 07/13/1995    Allergies: Allergies[1] Medications: Current Medications[2]  Observations/Objective: Patient is well-developed, well-nourished in no acute distress.  Resting comfortably at home.  Head is normocephalic, atraumatic.  No labored breathing.  Speech is clear and coherent with logical content.  Patient is alert and oriented at baseline.    Assessment and Plan: 1. Acute bacterial pharyngitis (Primary) - amoxicillin -clavulanate (AUGMENTIN ) 875-125 MG tablet; Take 1 tablet by mouth 2 (two) times daily for 10 days.  Dispense: 20 tablet; Refill: 0  - Suspect bacterial pharyngitis - Augmentin  prescribed - Tylenol  and Ibuprofen alternating every 4 hours - Salt water gargles - Chloraseptic spray - Liquid and soft food diet -  Push fluids - New toothbrush in 3 days - Seek in person evaluation if not improving or if symptoms worsen   Follow Up Instructions: I discussed the assessment and treatment plan with the patient. The patient was provided an opportunity to ask questions and all were answered. The patient agreed with the plan and demonstrated an understanding of the instructions.  A copy of instructions were sent to the patient via MyChart unless otherwise noted below.    The patient was advised to call back or seek an in-person evaluation if the symptoms worsen or if the condition fails to improve as anticipated.    Delon CHRISTELLA Dickinson, PA-C     [1]  Allergies Allergen Reactions   Grass Pollen(K-O-[id number]-T-Swt Vern) Cough, Itching and Shortness Of Breath   Cilostazol  Other (See Comments)    severe headaches   Lipitor [Atorvastatin] Other (See Comments)    Liver issues    Lisinopril Cough  [2]  Current Outpatient Medications:    amoxicillin -clavulanate (AUGMENTIN ) 875-125 MG tablet, Take 1 tablet by mouth 2 (two) times daily for 10 days., Disp: 20 tablet, Rfl: 0   ascorbic acid (VITAMIN C) 500 MG tablet, Take 500 mg by mouth in the morning and at bedtime., Disp: , Rfl:    aspirin  81 MG chewable tablet, Chew 1 tablet (81 mg total) by mouth daily., Disp: 90 tablet, Rfl: 0  atenolol  (TENORMIN ) 100 MG tablet, Take 1 tablet (100 mg total) by mouth 2 (two) times daily., Disp: 180 tablet, Rfl: 1   B Complex-C (B-COMPLEX WITH VITAMIN C) tablet, Take 1 tablet by mouth daily., Disp: , Rfl:    cetirizine  (ZYRTEC ) 10 MG tablet, Take 1 tablet (10 mg total) by mouth daily. (Patient taking differently: Take 10 mg by mouth at bedtime.), Disp: 30 tablet, Rfl: 11   Cholecalciferol (VITAMIN D3 PO), Take 1 tablet by mouth daily., Disp: , Rfl:    Cyanocobalamin (VITAMIN B-12) 5000 MCG TBDP, Take 5,000 mcg by mouth daily., Disp: , Rfl:    Digestive Enzymes (DIGESTIVE ENZYME PO), Take 1 capsule by mouth in the morning and at  bedtime. Nu-Zymes Plus Digestive Enzymes, Disp: , Rfl:    famotidine  (PEPCID ) 20 MG tablet, Take 2 tablets (40 mg total) by mouth at bedtime., Disp: 180 tablet, Rfl: 1   fluticasone  (FLONASE ) 50 MCG/ACT nasal spray, Place 2 sprays into both nostrils daily for 1-2 weeks at a time before stopping once nasal congestion improves for maximum benefit (Patient taking differently: Place 2 sprays into both nostrils daily as needed for allergies.), Disp: 16 g, Rfl: 5   isosorbide  mononitrate (IMDUR ) 60 MG 24 hr tablet, Take 1 tablet (60 mg total) by mouth daily., Disp: 90 tablet, Rfl: 1   KRILL OIL PO, Take 500 mg by mouth daily., Disp: , Rfl:    losartan  (COZAAR ) 50 MG tablet, Take 1 tablet (50 mg total) by mouth in the morning and at bedtime., Disp: 180 tablet, Rfl: 1   metFORMIN  (GLUCOPHAGE ) 500 MG tablet, Take 1 tablet (500 mg total) by mouth 2 (two) times daily with a meal., Disp: 180 tablet, Rfl: 1   montelukast  (SINGULAIR ) 10 MG tablet, Take 1 tablet (10 mg total) by mouth at bedtime., Disp: 30 tablet, Rfl: 5   NIFEdipine  (PROCARDIA  XL/NIFEDICAL XL) 60 MG 24 hr tablet, Take 1 tablet (60 mg total) by mouth in the morning and at bedtime., Disp: 180 tablet, Rfl: 3   nitroGLYCERIN  (NITROSTAT ) 0.4 MG SL tablet, Place 1 tablet (0.4 mg total) under the tongue every 5 (five) minutes as needed for chest pain (max dose of 3. At initiation of 3rd dose call 911.)., Disp: 30 tablet, Rfl: 1   PATADAY  0.2 % SOLN, Place 1 drop into both eyes daily as needed., Disp: 2.5 mL, Rfl: 5   Rivaroxaban  (XARELTO ) 15 MG TABS tablet, Take 1 tablet (15 mg total) by mouth daily with supper., Disp: 90 tablet, Rfl: 1   Rivaroxaban  (XARELTO ) 15 MG TABS tablet, Take 1 tablet (15 mg total) by mouth daily with supper., Disp: 90 tablet, Rfl: 1   rosuvastatin  (CRESTOR ) 20 MG tablet, Take 1 tablet (20 mg total) by mouth daily. (Patient taking differently: Take 20 mg by mouth every evening.), Disp: 90 tablet, Rfl: 1  "

## 2024-04-21 NOTE — Patient Instructions (Signed)
 " Frederick Tyler, thank you for joining Delon CHRISTELLA Dickinson, PA-C for today's virtual visit.  While this provider is not your primary care provider (PCP), if your PCP is located in our provider database this encounter information will be shared with them immediately following your visit.   A [facility] MyChart account gives you access to today's visit and all your visits, tests, and labs performed at [facility]  click here if you don't have a [facility] MyChart account or go to [email or web address]  Consent: (Patient) Frederick Tyler provided verbal consent for this virtual visit at the beginning of the encounter.  Current Medications:  Current Outpatient Medications:    amoxicillin -clavulanate (AUGMENTIN ) 875-125 MG tablet, Take 1 tablet by mouth 2 (two) times daily for 10 days., Disp: 20 tablet, Rfl: 0   ascorbic acid (VITAMIN C) 500 MG tablet, Take 500 mg by mouth in the morning and at bedtime., Disp: , Rfl:    aspirin  81 MG chewable tablet, Chew 1 tablet (81 mg total) by mouth daily., Disp: 90 tablet, Rfl: 0   atenolol  (TENORMIN ) 100 MG tablet, Take 1 tablet (100 mg total) by mouth 2 (two) times daily., Disp: 180 tablet, Rfl: 1   B Complex-C (B-COMPLEX WITH VITAMIN C) tablet, Take 1 tablet by mouth daily., Disp: , Rfl:    cetirizine  (ZYRTEC ) 10 MG tablet, Take 1 tablet (10 mg total) by mouth daily. (Patient taking differently: Take 10 mg by mouth at bedtime.), Disp: 30 tablet, Rfl: 11   Cholecalciferol (VITAMIN D3 PO), Take 1 tablet by mouth daily., Disp: , Rfl:    Cyanocobalamin (VITAMIN B-12) 5000 MCG TBDP, Take 5,000 mcg by mouth daily., Disp: , Rfl:    Digestive Enzymes (DIGESTIVE ENZYME PO), Take 1 capsule by mouth in the morning and at bedtime. Nu-Zymes Plus Digestive Enzymes, Disp: , Rfl:    famotidine  (PEPCID ) 20 MG tablet, Take 2 tablets (40 mg total) by mouth at bedtime., Disp: 180 tablet, Rfl: 1   fluticasone  (FLONASE ) 50 MCG/ACT nasal spray, Place 2 sprays into both  nostrils daily for 1-2 weeks at a time before stopping once nasal congestion improves for maximum benefit (Patient taking differently: Place 2 sprays into both nostrils daily as needed for allergies.), Disp: 16 g, Rfl: 5   isosorbide  mononitrate (IMDUR ) 60 MG 24 hr tablet, Take 1 tablet (60 mg total) by mouth daily., Disp: 90 tablet, Rfl: 1   KRILL OIL PO, Take 500 mg by mouth daily., Disp: , Rfl:    losartan  (COZAAR ) 50 MG tablet, Take 1 tablet (50 mg total) by mouth in the morning and at bedtime., Disp: 180 tablet, Rfl: 1   metFORMIN  (GLUCOPHAGE ) 500 MG tablet, Take 1 tablet (500 mg total) by mouth 2 (two) times daily with a meal., Disp: 180 tablet, Rfl: 1   montelukast  (SINGULAIR ) 10 MG tablet, Take 1 tablet (10 mg total) by mouth at bedtime., Disp: 30 tablet, Rfl: 5   NIFEdipine  (PROCARDIA  XL/NIFEDICAL XL) 60 MG 24 hr tablet, Take 1 tablet (60 mg total) by mouth in the morning and at bedtime., Disp: 180 tablet, Rfl: 3   nitroGLYCERIN  (NITROSTAT ) 0.4 MG SL tablet, Place 1 tablet (0.4 mg total) under the tongue every 5 (five) minutes as needed for chest pain (max dose of 3. At initiation of 3rd dose call 911.)., Disp: 30 tablet, Rfl: 1   PATADAY  0.2 % SOLN, Place 1 drop into both eyes daily as needed., Disp: 2.5 mL, Rfl: 5   Rivaroxaban  (XARELTO ) 15 MG  TABS tablet, Take 1 tablet (15 mg total) by mouth daily with supper., Disp: 90 tablet, Rfl: 1   Rivaroxaban  (XARELTO ) 15 MG TABS tablet, Take 1 tablet (15 mg total) by mouth daily with supper., Disp: 90 tablet, Rfl: 1   rosuvastatin  (CRESTOR ) 20 MG tablet, Take 1 tablet (20 mg total) by mouth daily. (Patient taking differently: Take 20 mg by mouth every evening.), Disp: 90 tablet, Rfl: 1   Medications ordered in this encounter:  Meds ordered this encounter  Medications   DISCONTD: amoxicillin -clavulanate (AUGMENTIN ) 875-125 MG tablet    Sig: Take 1 tablet by mouth 2 (two) times daily for 10 days.    Dispense:  20 tablet    Refill:  0     Supervising Provider:   LAMPTEY, PHILIP O [id number]   amoxicillin -clavulanate (AUGMENTIN ) 875-125 MG tablet    Sig: Take 1 tablet by mouth 2 (two) times daily for 10 days.    Dispense:  20 tablet    Refill:  0    Supervising Provider:   LAMPTEY, PHILIP O [[id number]]     *If you need refills on other medications prior to your next appointment, please contact your pharmacy*  Follow-Up: Call back or seek an in-person evaluation if the symptoms worsen or if the condition fails to improve as anticipated.  [facility] [phone]  Other Instructions Pharyngitis  Pharyngitis is inflammation of the throat (pharynx). It is a very common cause of sore throat. Pharyngitis can be caused by a bacteria, but it is usually caused by a virus. Most cases of pharyngitis get better on their own without treatment. What are the causes? This condition may be caused by: Infection by viruses (viral). Viral pharyngitis spreads easily from person to person (is contagious) through coughing, sneezing, and sharing of personal items or utensils such as cups, forks, spoons, and toothbrushes. Infection by bacteria (bacterial). Bacterial pharyngitis may be spread by touching the nose or face after coming in contact with the bacteria, or through close contact, such as kissing. Allergies. Allergies can cause buildup of mucus in the throat (post-nasal drip), leading to inflammation and irritation. Allergies can also cause blocked nasal passages, forcing breathing through the mouth, which dries and irritates the throat. What increases the risk? You are more likely to develop this condition if: You are 95-38 years old. You are exposed to crowded environments such as daycare, school, or dormitory living. You live in a cold climate. You have a weakened disease-fighting (immune) system. What are the signs or symptoms? Symptoms of this condition vary by the cause. Common symptoms of this condition include: Sore  throat. Fatigue. Low-grade fever. Stuffy nose (nasal congestion) and cough. Headache. Other symptoms may include: Glands in the neck (lymph nodes) that are swollen. Skin rashes. Plaque-like film on the throat or tonsils. This is often a symptom of bacterial pharyngitis. Vomiting. Red, itchy eyes (conjunctivitis). Loss of appetite. Joint pain and muscle aches. Enlarged tonsils. How is this diagnosed? This condition may be diagnosed based on your medical history and a physical exam. Your health care provider will ask you questions about your illness and your symptoms. A swab of your throat may be done to check for bacteria (rapid strep test). Other lab tests may also be done, depending on the suspected cause, but these are rare. How is this treated? Many times, treatment is not needed for this condition. Pharyngitis usually gets better in 3-4 days without treatment. Bacterial pharyngitis may be treated with antibiotic medicines. Follow  these instructions at home: Medicines Take over-the-counter and prescription medicines only as told by your health care provider. If you were prescribed an antibiotic medicine, take it as told by your health care provider. Do not stop taking the antibiotic even if you start to feel better. Use throat sprays to soothe your throat as told by your health care provider. Children can get pharyngitis. Do not give your child aspirin  because of the association with Reye's syndrome. Managing pain To help with pain, try: Sipping warm liquids, such as broth, herbal tea, or warm water. Eating or drinking cold or frozen liquids, such as frozen ice pops. Gargling with a mixture of salt and water 3-4 times a day or as needed. To make salt water, completely dissolve -1 tsp (3-6 g) of salt in 1 cup (237 mL) of warm water. Sucking on hard candy or throat lozenges. Putting a cool-mist humidifier in your bedroom at night to moisten the air. Sitting in the bathroom with the  door closed for 5-10 minutes while you run hot water in the shower.  General instructions  Do not use any products that contain nicotine or tobacco. These products include cigarettes, chewing tobacco, and vaping devices, such as e-cigarettes. If you need help quitting, ask your health care provider. Rest as told by your health care provider. Drink enough fluid to keep your urine pale yellow. How is this prevented? To help prevent becoming infected or spreading infection: Wash your hands often with soap and water for at least 20 seconds. If soap and water are not available, use hand sanitizer. Do not touch your eyes, nose, or mouth with unwashed hands, and wash hands after touching these areas. Do not share cups or eating utensils. Avoid close contact with people who are sick. Contact a health care provider if: You have large, tender lumps in your neck. You have a rash. You cough up green, yellow-brown, or bloody mucus. Get help right away if: Your neck becomes stiff. You drool or are unable to swallow liquids. You cannot drink or take medicines without vomiting. You have severe pain that does not go away, even after you take medicine. You have trouble breathing, and it is not caused by a stuffy nose. You have new pain and swelling in your joints such as the knees, ankles, wrists, or elbows. These symptoms may represent a serious problem that is an emergency. Do not wait to see if the symptoms will go away. Get medical help right away. Call your local emergency services (911 in the [place] Do not drive yourself to the hospital. Summary Pharyngitis is redness, pain, and swelling (inflammation) of the throat (pharynx). While pharyngitis can be caused by a bacteria, the most common causes are viral. Most cases of pharyngitis get better on their own without treatment. Bacterial pharyngitis is treated with antibiotic medicines. This information is not intended to replace advice given to you by  your health care provider. Make sure you discuss any questions you have with your health care provider. Document Revised: 06/09/2020 Document Reviewed: 06/09/2020 Elsevier Patient Education  2024 [employer]   If you have been instructed to have an in-person evaluation today at a local Urgent Care facility, please use the link below. It will take you to a list of all of our available [facility], including address, phone number and hours of operation. Please do not delay care.  [facility]  If you or a family member do not have a primary care  provider, use the link below to schedule a visit and establish care. When you choose a [facility] primary care physician or advanced practice provider, you gain a long-term partner in health. Find a Primary Care Provider  Learn more about [facility] in-office and virtual care options:  - Get Care Now "

## 2024-11-18 ENCOUNTER — Encounter (HOSPITAL_BASED_OUTPATIENT_CLINIC_OR_DEPARTMENT_OTHER)
